# Patient Record
Sex: Female | Born: 1945
Health system: Southern US, Community
[De-identification: ages and names within clinical notes are randomized; demographics above are authoritative.]

## PROBLEM LIST (undated history)

## (undated) DIAGNOSIS — T7840XA Allergy, unspecified, initial encounter: Secondary | ICD-10-CM

## (undated) DIAGNOSIS — H269 Unspecified cataract: Secondary | ICD-10-CM

## (undated) DIAGNOSIS — M810 Age-related osteoporosis without current pathological fracture: Secondary | ICD-10-CM

## (undated) DIAGNOSIS — K5900 Constipation, unspecified: Secondary | ICD-10-CM

## (undated) DIAGNOSIS — C801 Malignant (primary) neoplasm, unspecified: Secondary | ICD-10-CM

## (undated) DIAGNOSIS — G4700 Insomnia, unspecified: Secondary | ICD-10-CM

## (undated) DIAGNOSIS — J302 Other seasonal allergic rhinitis: Secondary | ICD-10-CM

## (undated) DIAGNOSIS — C569 Malignant neoplasm of unspecified ovary: Secondary | ICD-10-CM

## (undated) DIAGNOSIS — F419 Anxiety disorder, unspecified: Secondary | ICD-10-CM

## (undated) DIAGNOSIS — M1990 Unspecified osteoarthritis, unspecified site: Secondary | ICD-10-CM

## (undated) DIAGNOSIS — E785 Hyperlipidemia, unspecified: Secondary | ICD-10-CM

## (undated) HISTORY — DX: Unspecified osteoarthritis, unspecified site: M19.90

## (undated) HISTORY — DX: Unspecified cataract: H26.9

## (undated) HISTORY — DX: Anxiety disorder, unspecified: F41.9

## (undated) HISTORY — DX: Allergy, unspecified, initial encounter: T78.40XA

## (undated) HISTORY — DX: Insomnia, unspecified: G47.00

## (undated) HISTORY — PX: THUMB FUSION: SUR636

## (undated) HISTORY — PX: ABDOMINAL HYSTERECTOMY: SHX81

## (undated) HISTORY — PX: COSMETIC SURGERY: SHX468

## (undated) HISTORY — DX: Constipation, unspecified: K59.00

## (undated) HISTORY — DX: Age-related osteoporosis without current pathological fracture: M81.0

## (undated) HISTORY — PX: WISDOM TOOTH EXTRACTION: SHX21

## (undated) HISTORY — PX: OTHER SURGICAL HISTORY: SHX169

## (undated) HISTORY — DX: Hyperlipidemia, unspecified: E78.5

---

## 2001-02-24 ENCOUNTER — Other Ambulatory Visit: Admission: RE | Admit: 2001-02-24 | Discharge: 2001-02-24 | Payer: Self-pay | Admitting: Obstetrics and Gynecology

## 2002-03-08 ENCOUNTER — Other Ambulatory Visit: Admission: RE | Admit: 2002-03-08 | Discharge: 2002-03-08 | Payer: Self-pay | Admitting: Obstetrics and Gynecology

## 2003-03-22 ENCOUNTER — Other Ambulatory Visit: Admission: RE | Admit: 2003-03-22 | Discharge: 2003-03-22 | Payer: Self-pay | Admitting: Obstetrics and Gynecology

## 2004-04-24 ENCOUNTER — Other Ambulatory Visit: Admission: RE | Admit: 2004-04-24 | Discharge: 2004-04-24 | Payer: Self-pay | Admitting: Gynecology

## 2004-10-31 ENCOUNTER — Ambulatory Visit (HOSPITAL_COMMUNITY): Admission: RE | Admit: 2004-10-31 | Discharge: 2004-10-31 | Payer: Self-pay | Admitting: Gastroenterology

## 2005-07-02 ENCOUNTER — Other Ambulatory Visit: Admission: RE | Admit: 2005-07-02 | Discharge: 2005-07-02 | Payer: Self-pay | Admitting: Gynecology

## 2006-08-19 ENCOUNTER — Other Ambulatory Visit: Admission: RE | Admit: 2006-08-19 | Discharge: 2006-08-19 | Payer: Self-pay | Admitting: Gynecology

## 2007-08-30 ENCOUNTER — Other Ambulatory Visit: Admission: RE | Admit: 2007-08-30 | Discharge: 2007-08-30 | Payer: Self-pay | Admitting: Gynecology

## 2007-10-20 ENCOUNTER — Ambulatory Visit (HOSPITAL_BASED_OUTPATIENT_CLINIC_OR_DEPARTMENT_OTHER): Admission: RE | Admit: 2007-10-20 | Discharge: 2007-10-20 | Payer: Self-pay | Admitting: Orthopedic Surgery

## 2008-07-14 HISTORY — PX: TOTAL KNEE ARTHROPLASTY: SHX125

## 2008-11-08 ENCOUNTER — Inpatient Hospital Stay (HOSPITAL_COMMUNITY): Admission: RE | Admit: 2008-11-08 | Discharge: 2008-11-11 | Payer: Self-pay | Admitting: Orthopedic Surgery

## 2010-10-22 LAB — PROTIME-INR: Prothrombin Time: 21.5 seconds — ABNORMAL HIGH (ref 11.6–15.2)

## 2010-10-22 LAB — CBC
HCT: 27 % — ABNORMAL LOW (ref 36.0–46.0)
Platelets: 210 10*3/uL (ref 150–400)
RBC: 2.92 MIL/uL — ABNORMAL LOW (ref 3.87–5.11)
WBC: 9.6 10*3/uL (ref 4.0–10.5)

## 2010-10-23 LAB — DIFFERENTIAL
Basophils Absolute: 0 10*3/uL (ref 0.0–0.1)
Basophils Relative: 1 % (ref 0–1)
Eosinophils Absolute: 0.1 10*3/uL (ref 0.0–0.7)
Eosinophils Relative: 2 % (ref 0–5)
Neutrophils Relative %: 54 % (ref 43–77)

## 2010-10-23 LAB — ABO/RH: ABO/RH(D): A POS

## 2010-10-23 LAB — COMPREHENSIVE METABOLIC PANEL
ALT: 26 U/L (ref 0–35)
AST: 27 U/L (ref 0–37)
Alkaline Phosphatase: 61 U/L (ref 39–117)
CO2: 30 mEq/L (ref 19–32)
Chloride: 102 mEq/L (ref 96–112)
GFR calc Af Amer: >60 mL/min (ref >60)
GFR calc non Af Amer: >60 mL/min (ref >60)
Glucose, Bld: 87 mg/dL (ref 70–99)
Sodium: 136 mEq/L (ref 135–145)
Total Bilirubin: 0.4 mg/dL (ref 0.3–1.2)

## 2010-10-23 LAB — CBC
Hemoglobin: 13.2 g/dL (ref 12.0–15.0)
Hemoglobin: 9.5 g/dL — ABNORMAL LOW (ref 12.0–15.0)
MCV: 92.9 fL (ref 78.0–100.0)
RBC: 3 MIL/uL — ABNORMAL LOW (ref 3.87–5.11)
RBC: 3.18 MIL/uL — ABNORMAL LOW (ref 3.87–5.11)
RBC: 4.19 MIL/uL (ref 3.87–5.11)
WBC: 13.3 10*3/uL — ABNORMAL HIGH (ref 4.0–10.5)
WBC: 7.1 10*3/uL (ref 4.0–10.5)

## 2010-10-23 LAB — PROTIME-INR
INR: 1.1 (ref 0.00–1.49)
INR: 1.8 — ABNORMAL HIGH (ref 0.00–1.49)
Prothrombin Time: 12.1 seconds (ref 11.6–15.2)
Prothrombin Time: 14.2 seconds (ref 11.6–15.2)

## 2010-10-23 LAB — URINALYSIS, ROUTINE W REFLEX MICROSCOPIC
Bilirubin Urine: NEGATIVE
Glucose, UA: NEGATIVE mg/dL
Hgb urine dipstick: NEGATIVE
Ketones, ur: NEGATIVE mg/dL
Protein, ur: NEGATIVE mg/dL

## 2010-10-23 LAB — BASIC METABOLIC PANEL
Chloride: 99 mEq/L (ref 96–112)
Creatinine, Ser: 0.64 mg/dL (ref 0.4–1.2)
GFR calc Af Amer: >60 mL/min (ref >60)
Sodium: 132 mEq/L — ABNORMAL LOW (ref 135–145)

## 2010-10-23 LAB — TYPE AND SCREEN: ABO/RH(D): A POS

## 2010-11-26 NOTE — Op Note (Signed)
NAME:  Angela Garza, Angela Garza                ACCOUNT NO.:  [id number]   MEDICAL RECORD NO.:  [id number]          PATIENT TYPE:  INP   LOCATION:  5015                         FACILITY:  [facility]   PHYSICIAN:  Harvie Junior, M.D.   DATE OF BIRTH:  December 28, 1945   DATE OF PROCEDURE:  11/08/2008  DATE OF DISCHARGE:                               OPERATIVE REPORT   PREOPERATIVE DIAGNOSIS:  End-stage degenerative joint disease of the  left knee with severe patellofemoral chondromalacia.   POSTOPERATIVE DIAGNOSIS:  End-stage degenerative joint disease of the  left knee with severe patellofemoral chondromalacia.   PROCEDURES:  1. Left total knee replacement with a Sigma System with a size 4      narrow femur, size 3 tibia, a 10-mm bridging bearing and a 35-mm      all poly patella.  2. Computer-assisted left total knee replacement   SURGEON:  Harvie Junior, MD   ASSISTANT:  Marshia Ly, PA   ANESTHESIA:  General.   BRIEF HISTORY:  Mrs. Capes is a 65 year old female with a long history  of having a severe patellofemoral disease on her left knee.  We treated  her with arthroscopy, lateral release and debridement.  She had also had  some fairly significant chondromalacia on the medial side and some on  the lateral side certainly her greatest disease in the patellofemoral  area.  She struggled with this for a prolonged period of time because of  continued complaints of pain in the patellofemoral area.  She has also  been taken to the operating room for total knee replacement.  Because of  her young age, we felt that computer-assisted total knee replacement  would be the most appropriate course of action and this was chosen to be  used preoperatively.   PROCEDURE:  The patient was taken to the operating room.  After adequate  anesthesia was obtained with general anesthetic, the patient was placed  supine on the operating table.  The left leg was prepped and draped in  usual sterile fashion.   Following this, the leg was exsanguinated and  blood pressure tourniquet was inflated to 350 mmHg.  Following this, a  midline incision was made.  Subcutaneous tissues were dissected down to  the level of the extensor mechanism where a medial parapatellar  arthrotomy was undertaken.  Following this the medial and lateral  meniscus were removed as well as anterior and posterior cruciates as  well as the retropatellar fat pad and at this point attention was turned  towards the computer modules where two pins were placed in the tibia,  two pins in the femur and the arrays were placed.  Once this was  completed, the hip was put through a range of motion and the  registration process undertaken.  This adds about 30 minutes of the  surgical procedure.  Once the range of motion have been tested then the  cuts were made.  We cut the tibia perpendicular to the long axis under  computer assistance, cut the femur perpendicular to the anatomic axis  under computer assistance.  Interestingly,  prior to going to the flexion  gap, the width of the femur tested at about 2.5.  Ultimately when we got  to the anterior and posterior dimension that really measured to a size  4.  This was a little bit concerning just relative to the overall  potential for overhang in the case.  Ultimately, there was a 4 narrow  which guide [place] essentially to a 3 in the medial and lateral side and  because of the balancing of the flexion/extension gap, we felt that 4  narrow was going to be the appropriate implant.  At this point, the size  4 cutting guide was used to cut the anterior posterior areas and  following this we went to the chamfer cuts and then went to the box cut.  The 4 narrow did have a little bit of overhang medially and laterally up  on the femur but otherwise was a reasonable fit down over the condyles.  Attention was then turned to the tibia which was sized to a 3 and the  rotational alignment was then chosen as  well as the keel and this was  put into place.  Once this was completed, a 10-mm bridging bearing was  placed and a trial range of motion with full extension, computer  assistance alignment we had perfect neutral long alignment.  We cut the  patella down to a size 13 and then used a 35 paddle.  Still there was  some ridges especially on that lateral side which were deeper than this  12.  We put the paddle on and did those lugs put the lugs on the femur.  At this point, all the trial components were removed.  The knee was  copiously and thoroughly lavaged and irrigated.  The final components  were cemented into place size 4 narrow femur, size 3 tibia, 10-mm  bridging bearing and a 35-mm all poly patella.  We allowed the cement to  set up and checked the computer one more time had perfect neutral long  alignment.  We had excellent stability in both flexion/extension and  perfect gap balance of flexion/extension.  At this point, the cement was  allowed to harden and the patellar clamp was removed.  The knee was  copiously and thoroughly irrigated and put through a range of motion.  Excellent midline patellar tracking at this point and at this point the  medium Hemovac drain was placed.  A medial parapatellar arthrotomy was  closed with 1 Vicryl running, the skin with 0 and 2-0 Vicryl and 3-0  Monocryl subcuticular stitches.  Benzoin and Steri-Strips were applied.  Sterile compressive dressing was applied.  The patient was taken to the  recovery room where she was noted to be in satisfactory condition.  Estimated blood loss for the procedure was none.  Of note, when the  final components were cemented into place, we used a size 10 trial and  after the trial had been removed the tourniquet was let down.  We  controlled all bleeding with electrocautery and the final poly was then  placed and we then went through a range of motion at this point and this  was excellent and significant bleeding was  encountered.  Knee was again  copiously and thoroughly lavaged and then closed as outlined above.      Harvie Junior, M.D.  Electronically Signed     JLG/MEDQ  D:  11/08/2008  T:  11/09/2008  Job:  [id number]

## 2010-11-26 NOTE — Op Note (Signed)
NAME:  Angela Garza, Angela Garza                ACCOUNT NO.:  [id number]   MEDICAL RECORD NO.:  [id number]          PATIENT TYPE:  AMB   LOCATION:  DSC                          FACILITY:  [facility]   PHYSICIAN:  Harvie Junior, M.D.   DATE OF BIRTH:  [birth year]   DATE OF PROCEDURE:  10/20/2007  DATE OF DISCHARGE:                               OPERATIVE REPORT   PREOPERATIVE DIAGNOSIS:  Medial meniscal tear with chondromalacia  patella.   POSTOPERATIVE DIAGNOSIS:  Medial meniscal tear with chondromalacia  patella.   PROCEDURE:  1. Partial posterior horn medial meniscectomy.  2. Debridement of chondromalacia patella.   SURGEON:  Harvie Junior, M.D.   ASSISTANT:  Marshia Ly, P.A.   ANESTHESIA:  General.   BRIEF HISTORY:  The patient is a 64 year old female with a long history  of having had significant bilateral knee pain with severe patellofemoral  disease bilaterally.  She had undergone bilateral arthroscopy with  lateral retinacular release several years ago that had done well for  her.  She began having increasing catching and grabbing pain on the  inboard side and because of this she was taken to the operating room for  operative knee arthroscopy after failure of conservative care including  injection therapy, anti-inflammatory medication.   DESCRIPTION OF PROCEDURE:  The patient was taken to the operating room  and after adequate anesthesia was obtained by general anesthesia, the  patient was placed on the operating table.  The left leg was prepped and  draped in the usual sterile fashion.  Following this routine  arthroscopic examination of the knee revealed there was an obvious  posterior horn medial meniscectomy.  As we debrided the posterior horn,  we actually found the opening into the cystic area posteriorly and the  posterior horn was debrided to allow saucerization of the cyst.  Once  this was completed, attention was turned to the ACL which was normal.  Lateral side had a  little bit of an anterior horn tear which was  debrided.  The lateral side had some degenerative changes but not bad.  The medial side did have a grade 3 change over a fairly significant area  which was debrided.  Some of these fragments were loose, so we flexed  her back to about 100 degrees and debrided these fragments.  Once this  was completed, attention was then turned up into the patellofemoral  joint where there was that obvious grooved area.  This grooved area  really came over the top and almost into the distal femoral area, quite  an impressive grooving of the lateral femoral condyle.  This was  debrided.  The lateral retinacular release was checked and this  certainly was doing well and there was not significant pressure on the  patellofemoral joint.  At this point the knee was copiously and  thoroughly irrigated and suctioned dry.  Final check was made for any  loose fragments and seeing none, this was thoroughly irrigated and  suctioned  dry.  The arthroscopic portals were closed with a bandage.  Sterile  compressive dressing was applied.  The patient was taken to the recovery  room where she was noted to be in satisfactory condition.  Estimated  blood loss for the procedure was none.      Harvie Junior, M.D.  Electronically Signed     JLG/MEDQ  D:  10/20/2007  T:  10/20/2007  Job:  [id number]

## 2010-11-29 NOTE — Discharge Summary (Signed)
NAMEKHARISMA, GLASNER                ACCOUNT NO.:  [id number]   MEDICAL RECORD NO.:  [id number]          PATIENT TYPE:  INP   LOCATION:  5015                         FACILITY:  [facility]   PHYSICIAN:  Harvie Junior, M.D.   DATE OF BIRTH:  10-12-1945   DATE OF ADMISSION:  11/08/2008  DATE OF DISCHARGE:  11/11/2008                               DISCHARGE SUMMARY   ADMITTING DIAGNOSES:  1. Degenerative joint disease, left knee.  2. Osteopenia.  3. Hyperlipidemia.  4. History of ovarian cancer.  5. History of benign cardiac palpitations.   DISCHARGE DIAGNOSES:  1. Degenerative joint disease, left knee.  2. Osteopenia.  3. Hyperlipidemia.  4. History of ovarian cancer.  5. History of benign cardiac palpitations.   PROCEDURES IN THE HOSPITAL:  The left total knee arthroplasty, Harvie Junior, MD, November 08, 2008.   BRIEF HISTORY:  Anastasha Ortez is a 65 year old female who has a long  history of left knee pain.  She complains of pain with ambulation at  rest.  She got no relief with conservative treatment.  X-rays showed  severe patellofemoral arthritis of the left knee with joint space  narrowing medially.  She had marked spurring in the patellofemoral  joint.  She got no relief with exhaustive conservative treatment  including injection therapy, knee arthroscopy, and use of medication and  modification of activity.  Based upon her clinical and radiographic  findings, she is felt to be a candidate for a left total knee  arthroplasty and is admitted for this.   PERTINENT LABORATORY STUDIES:  Her EKG on admission showed normal sinus  rhythm with possible left atrial enlargement.  No significant change  since last tracing.  Hemoglobin on admission was 13.2 and hematocrit  38.9.  Indices within normal limits.  On postop day #1, her hemoglobin  was 7.1; #2, 9.5; and #3 9.2.  Her Protime on admission was 12.1 seconds  and INR of 0.9.  PTT was 24.  On the day of discharge, her Protime was  21.5 seconds with an INR of 1.8.  Her CMET on admission was within  normal limits with a slightly decreased potassium at 132, now felt to be  clinically significant.  Urinalysis showed no abnormalities.   HOSPITAL COURSE:  The patient underwent computer-assisted left total  knee replacement as well described in Dr. Luiz Blare' operative note on the  day of admission, which was, November 08, 2008.  Procedure went without  complications.  Preoperatively, she was given a gram of Ancef and 80 mg  of gentamicin and 1 g of Ancef was given q.8 h. x3 doses  postoperatively.  She started on Coumadin antithrombotic therapy per  pharmacy.  PCA Dilaudid was used for pain control.   On postop day #1, the patient stated that she had moderate knee pain.  She is taking fluids without difficulty.  She was voiding as her Foley  was removed.  Her vital signs are stable.  She is afebrile.  Her  hemoglobin was 10.1.  Her BMET was within normal range.  Her INR was  1.1.  She is continued on the PCA Dilaudid and she was mobilized with  physical therapy.   On postop day #2, the patient was overall improving, making some  progress.  She had no complaints.  She was taking fluids and voiding  without difficulty.  She has progressed well with therapy.  She did have  a temperature of 100.9.  Her lungs are clear.  Her left knee wound was  benign.  Her dressing was changed and her Hemovac drain was pulled.  Her  hemoglobin is 9.5.  Her INR was 1.8.  Her IV and PCA Dilaudid were  discontinued and her IV was converted to a saline lock.   On postop day #3, the patient was doing well.  She is taking fluids  without difficulty and voiding.  She had a low-grade fever of 100.3, was  then found to be afebrile.  Her left knee dressing was benign.  Her  hemoglobin was 9.2.  She was discharged home in improved condition.  She  was given Rx for Percocet 5 mg p.r.n. for pain, Coumadin as rated x1  month postop for DVT prophylaxis and  Robaxin 750 mg 1 q.8 h. p.r.n.  spasm.  She will get home health physical therapy and a home health RN  for Protimes and Coumadin management x1 month.  She will only get a home  CPM for her knee range of motion.  She will ambulate weightbearing as  tolerated on the left with a walker.  She will follow up with Dr. Luiz Blare  in the office in 10-11 days.  If she has any problem, she will let us  know.      Marshia Ly, P.A.      Harvie Junior, M.D.  Electronically Signed    JB/MEDQ  D:  01/10/2009  T:  01/11/2009  Job:  [id number]

## 2010-11-29 NOTE — Op Note (Signed)
Angela Garza, Angela Garza                ACCOUNT NO.:  [id number]   MEDICAL RECORD NO.:  [id number]          PATIENT TYPE:  AMB   LOCATION:  ENDO                         FACILITY:  [facility]   PHYSICIAN:  John C. Madilyn Fireman, M.D.    DATE OF BIRTH:  [birth year]   DATE OF PROCEDURE:  10/31/2004  DATE OF DISCHARGE:                                 OPERATIVE REPORT   PROCEDURE PERFORMED:  Colonoscopy.   INDICATIONS FOR PROCEDURE:  Family history of colon cancer in first-degree  relative.   PROCEDURE:  The patient was placed in the left lateral decubitus position  and placed on the pulse monitor with continuous low-flow oxygen delivered by  nasal cannula. She was sedated with 75 mcg IV fentanyl and 9 milligrams IV  Versed. Olympus video colonoscope was inserted into the rectum and advanced  to cecum with significant difficulty. There was a lot of angulation at the  sigmoid and rectosigmoid junction presumably from adhesions and there was a  lot of resistance to passage of scope beyond that point to the cecum but  eventually it was reached.  The prep was excellent. The cecum, ascending,  transverse, descending sigmoid and rectum all appeared normal with no  masses, polyps, diverticula or other mucosal abnormalities. The scope was  then withdrawn and the patient returned to the recovery room in stable  condition. She tolerated the procedure well. There were no immediate.   IMPRESSION:  Normal colonoscopy with some difficulty in advancing the scope  presumed secondary to adhesions.   PLAN:  Repeat study in five years.      JCH/MEDQ  D:  10/31/2004  T:  10/31/2004  Job:  [id number]   cc:   Dr. Roney Marion, [place]

## 2011-04-08 LAB — POCT HEMOGLOBIN-HEMACUE: Hemoglobin: 14.2

## 2013-04-10 ENCOUNTER — Observation Stay: Payer: Self-pay | Admitting: Internal Medicine

## 2013-04-10 LAB — CBC
HCT: 39.2 % (ref 35.0–47.0)
HGB: 13.3 g/dL (ref 12.0–16.0)
MCH: 30.6 pg (ref 26.0–34.0)
MCHC: 33.9 g/dL (ref 32.0–36.0)
Platelet: 263 10*3/uL (ref 150–440)
RBC: 4.33 10*6/uL (ref 3.80–5.20)
WBC: 13.3 10*3/uL — ABNORMAL HIGH (ref 3.6–11.0)

## 2013-04-10 LAB — COMPREHENSIVE METABOLIC PANEL
Albumin: 4 g/dL (ref 3.4–5.0)
BUN: 15 mg/dL (ref 7–18)
Bilirubin,Total: 0.2 mg/dL (ref 0.2–1.0)
Calcium, Total: 8.8 mg/dL (ref 8.5–10.1)
Chloride: 104 mmol/L (ref 98–107)
Co2: 28 mmol/L (ref 21–32)
EGFR (African American): >60
EGFR (Non-African Amer.): >60
Osmolality: 272 (ref 275–301)
SGOT(AST): 28 U/L (ref 15–37)
Total Protein: 7.1 g/dL (ref 6.4–8.2)

## 2013-04-10 LAB — TROPONIN I: Troponin-I: <0.02 ng/mL

## 2013-04-11 LAB — CBC WITH DIFFERENTIAL/PLATELET
Basophil #: 0 10*3/uL (ref 0.0–0.1)
Eosinophil #: 0.1 10*3/uL (ref 0.0–0.7)
HGB: 13.1 g/dL (ref 12.0–16.0)
Lymphocyte #: 1.1 10*3/uL (ref 1.0–3.6)
Lymphocyte %: 10.1 %
Neutrophil %: 84.7 %
Platelet: 253 10*3/uL (ref 150–440)
RBC: 4.21 10*6/uL (ref 3.80–5.20)
WBC: 10.7 10*3/uL (ref 3.6–11.0)

## 2013-04-11 LAB — LIPID PANEL
Triglycerides: 84 mg/dL (ref 0–200)
VLDL Cholesterol, Calc: 17 mg/dL (ref 5–40)

## 2013-04-11 LAB — TROPONIN I: Troponin-I: <0.02 ng/mL

## 2013-04-11 LAB — MAGNESIUM: Magnesium: 2.1 mg/dL

## 2014-11-03 NOTE — Consult Note (Signed)
PATIENT NAME:  Angela Garza, Angela Garza MR#:  [id number] DATE OF BIRTH:  1946-07-03  CARDIOLOGY CONSULTATION REPORT  DATE OF CONSULTATION:  04/11/2013  CONSULTING PHYSICIAN: Dr. Bridgett Larsson.   REASON FOR CONSULTATION: Chest pressure, with abnormal EKG.   CHIEF COMPLAINT: "I had chest pressure."   HISTORY OF PRESENT ILLNESS: This is a 69 year old female with a significant reaction to ant bites, with shortness of breath, swelling, rash, and chest pressure. The chest pressure did resolve fairly quickly after treatment of her rash and difficulty with breathing. The patient has had improvements of this rash. Now has an EKG change consistent with normal sinus rhythm with diffuse T wave inversions with incomplete right bundle branch block. The patient has not had any evidence of a myocardial infarction, with current normal troponin and CK-MB. The patient has had full resolution of her symptoms at this time.   The remainder review of systems negative for vision change, ringing in the ears, hearing loss, cough, congestion, heartburn, nausea, vomiting, diarrhea, bloody stools, stomach pain, extremity pain, leg weakness, cramping of the buttocks, known blood clots, headaches, blackouts, dizzy spells, nosebleeds, congestion, trouble swallowing, frequent urination, urination at night, muscle weakness, numbness, anxiety, depression,   PAST MEDICAL HISTORY: None.   FAMILY HISTORY: Mother had heart failure.   SOCIAL HISTORY: Currently denies alcohol or tobacco use.    ALLERGIES TO MEDICATIONS: AS LISTED.   PHYSICAL EXAM: VITAL SIGNS: Blood pressure is 122/68 bilaterally, heart rate 72 upright, reclining, and regular.  GENERAL: She is a well-appearing female in no acute distress.  HEENT: No icterus, thyromegaly, ulcers, hemorrhage, or xanthelasma.  CARDIOVASCULAR: Regular rate and rhythm. Normal S1 and S2 without murmur, gallop, or rub. PMI is normal size and placement. Carotid upstrokes normal, without bruit. Jugular venous  pressure is normal.  LUNGS: Lungs are clear to auscultation, with normal respirations.  ABDOMEN: Soft, nontender, without hepatosplenomegaly or masses. Abdominal aorta is normal-sized, without bruit.  EXTREMITIES: Show 2+ bilateral pulses in dorsal, pedal, radial and femoral arteries, without lower extremity edema, cyanosis, clubbing, or ulcers.  NEUROLOGIC: She is oriented to time, place, and person, with normal mood and affect.   ASSESSMENT: A 69 year old female with reaction to ant bites and abnormal EKG, with chest pressure, with very little risk factors and no current evidence of myocardial infarction, need further evaluation.   RECOMMENDATIONS: 1.  Echocardiogram for LV dysfunction, valvular heart disease causing abnormal EKG.  2. Ambulate and follow for any further significant symptoms, with possible discharge to home. Will surely follow up thereafter, depending on symptoms.  3. No need in additional medication management due to normal blood pressure.  4.  Further treatment options after above.     ____________________________ Corey Skains, MD bjk:[id number] D: 04/11/2013 08:25:57 ET T: 04/11/2013 08:36:42 ET JOB#: [id number]  cc: Corey Skains, MD, <Dictator> Corey Skains MD ELECTRONICALLY SIGNED 04/12/2013 10:36

## 2014-11-03 NOTE — H&P (Signed)
PATIENT NAME:  Angela Garza, Angela Garza MR#:  [id number] DATE OF BIRTH:  August 29, 1945  DATE OF ADMISSION:  04/10/2013  REFERRING PHYSICIAN:   Conni Slipper, MD  PRIMARY CARE PHYSICIAN: Nonlocal  CHIEF COMPLAINT: Insect bites and chest tightness today.   HISTORY OF PRESENT ILLNESS: A 69 year old Caucasian female with a history of hyperlipidemia, osteoarthritis, heart problem, presented to the ED with the above chief complaint. The patient is alert, awake, oriented and in no acute distress. The patient said she got an insect bite in her backyard and developed a rash on the left foot, and she feels itchy and felt chest tightness but she did not complain of any other symptoms. No orthopnea, no nocturnal dyspnea or leg edema. No cough, sputum or shortness of breath, but the patient said she has some tongue swelling, numb. She feels nervous.  The patient's EKG is abnormal. Dr. Cinda Quest requests the patient placed for observation.   PAST MEDICAL HISTORY: Hyperlipidemia, osteoarthritis, heart problem.   SOCIAL HISTORY: No smoking, alcohol drinking or illicit drugs.   FAMILY HISTORY: No hypertension, diabetes, heart attack or stroke, but has mother has high cholesterol and father has a colon cancer.   PAST SURGICAL HISTORY: Left knee replacement.   REVIEW OF SYSTEMS:   CONSTITUTIONAL: The patient denies any fever or chills. No headache or dizziness. No weakness.  EYES: No double vision or blurred vision.  EARS, NOSE, THROAT: No postnasal drip, slurred speech or dysphagia.  CARDIOVASCULAR: Positive for chest tightness, but no orthopnea, nocturnal dyspnea. No leg edema.  PULMONARY: No cough, sputum, shortness of breath or hemoptysis.  GASTROINTESTINAL: No abdominal pain, nausea, vomiting or diarrhea. No melena or bloody stool.  GENITOURINARY: No dysuria, hematuria, or incontinence.  SKIN: No rash or jaundice.  NEUROLOGY: No syncope, loss of consciousness or seizure.  HEMATOLOGY: No easy bruising or bleeding.   ENDOCRINE: No polyuria or polydipsia, heat or cold intolerance.   PHYSICAL EXAMINATION: VITAL SIGNS: Temperature 98, blood pressure 177/66, pulse 76, O2 saturation 100% on oxygen.  GENERAL: The patient is alert, awake, oriented, in no acute distress.  HEENT: Pupils are round, equal and reactive to light and accommodation.  Moist oral mucosa. Clear oropharynx. No swelling of tongue or lip. NECK: Supple. No JVD or carotid bruit. No lymphadenopathy. No thyromegaly.  CARDIOVASCULAR: S1, S2 regular rate and rhythm. No murmurs, gallops.   PULMONARY: Bilateral air entry. No wheezing or rales. No use of accessory muscle to breathe.  ABDOMEN: Soft. No distention. No tenderness. No organomegaly. Bowel sounds present.  EXTREMITIES: No edema, clubbing or cyanosis. No calf tenderness but has a local rash on the left foot with mild swelling and mild tenderness. Bilateral strong bilateral pedal pulses.  SKIN: Has a rash all over the body. No jaundice, no bruises.  NEUROLOGY: A and O x 3. No focal deficits. Point 5 out of 5. Sensation intact.   LABORATORY AND DIAGNOSTIC DATA: WBC 13.3, hemoglobin 13.3, platelets are 263. Troponin less than 0.02. Glucose 80, BUN 15, creatinine 0.93. Electrolytes normal. EKG showed normal sinus rhythm at 77 bpm with multiple lead T- wave inversion.   IMPRESSIONS: 1.  Chest tightness, chest pain, possibly due to an insect bite.  2.  Insect bite with allergy. 3.  Leukocytosis, possibly due to reaction.  4.  Abnormal EKG.  5.  Hypertension.  6.  Hyperlipidemia.   PLAN OF TREATMENT:   1.  The patient will be placed for observation. We will continue telemonitor. We will increase the aspirin to 325 mg  p.o. daily.  We will continue Zocor. Get a Cardiology consult. I discussed with Dr. Nehemiah Massed on the phone.  He suggested continue to the treatment, but no beta blocker at this time.  2.  We will follow up lipid panel, troponin and CBC.  3.  We will give Benadryl p.r.n. for  allergy.   CODE STATUS:  The patient is a FULL CODE.     TIME SPENT: About 48 minutes.    ____________________________ Demetrios Loll, MD qc:cb D: 04/10/2013 20:08:14 ET T: 04/10/2013 20:54:20 ET JOB#: [id number]  cc: Demetrios Loll, MD, <Dictator> Demetrios Loll MD ELECTRONICALLY SIGNED 04/11/2013 16:59

## 2014-11-03 NOTE — Discharge Summary (Signed)
PATIENT NAME:  Angela Garza, Angela Garza MR#:  [id number] DATE OF BIRTH:  11-10-1945  DATE OF ADMISSION:  04/10/2013 DATE OF DISCHARGE: 04/12/2013    DISCHARGE DIAGNOSES:  1.  Acute reaction to ant bite.  2.  EKG changes with no acute coronary syndrome and a normal echocardiogram.   CONSULTS: Dr. Nehemiah Massed.   IMAGING STUDIES DONE: Include a chest x-ray, portable, which showed no acute cardiopulmonary disease. Echocardiogram, which showed ejection fraction of 60% to 65% with normal wall motion. No significant valvular abnormalities.   ADMITTING HISTORY AND PHYSICAL: Please see detailed H and P dictated by Dr. Bridgett Larsson. In brief, a 69 year old female patient with history of hyperlipidemia, osteoarthritis, presented to the hospital complaining of insect bites and chest tightness, which was acute onset. The patient was admitted to the hospitalist service to treat her allergy and rule out acute coronary syndrome.   HOSPITAL COURSE:  1.  Allergic reaction. The patient was started on Solu-Medrol along with Benadryl p.r.n. for skin redness. Her redness has significantly improved. She had mild tongue swelling initially, which has resolved. No shortness of breath, wheezing or dysphagia. She will be on prednisone taper after discharge.  2.  Chest tightness. This is likely secondary from anxiety. She did have some EKG changes for which Dr. Nehemiah Massed of cardiology saw the patient. He did not feel this is acute coronary syndrome, but she did get an echocardiogram, which showed no wall motion abnormalities and has been cleared for discharge home from cardiac standpoint by Dr. Nehemiah Massed.   Prior to discharge, the patient has mild erythema in her feet. No tongue swelling. No wheezing on lung examination.   DISCHARGE MEDICATIONS: Include:  1.  Simvastatin 20 mg oral once a day.  2.  Calcium with vitamin D 1 tablet oral 3 times a day.  3.  Aspirin 81 mg daily.  4,  Benadryl 25 mg 3 times a day as needed for itching.  5.   Prednisone 60 mg tapered over 6 days.   DISCHARGE INSTRUCTIONS: Low-fat, low-cholesterol diet. Activity as tolerated. Follow up with primary care physician in 1 to 2 weeks.   Time spent on day of discharge was 35 minutes.  ____________________________ Leia Alf Tyr Franca, MD srs:aw D: 04/12/2013 13:21:20 ET T: 04/12/2013 13:44:43 ET JOB#: [id number]  cc: Alveta Heimlich R. Mardee Clune, MD, <Dictator> Neita Carp MD ELECTRONICALLY SIGNED 04/13/2013 15:32

## 2015-01-12 HISTORY — PX: COLONOSCOPY: SHX174

## 2016-08-07 DIAGNOSIS — H26492 Other secondary cataract, left eye: Secondary | ICD-10-CM | POA: Diagnosis not present

## 2016-08-07 DIAGNOSIS — H47323 Drusen of optic disc, bilateral: Secondary | ICD-10-CM | POA: Diagnosis not present

## 2016-08-07 DIAGNOSIS — H26491 Other secondary cataract, right eye: Secondary | ICD-10-CM | POA: Diagnosis not present

## 2016-08-20 DIAGNOSIS — Z789 Other specified health status: Secondary | ICD-10-CM | POA: Diagnosis not present

## 2016-08-20 DIAGNOSIS — M859 Disorder of bone density and structure, unspecified: Secondary | ICD-10-CM | POA: Diagnosis not present

## 2016-08-20 DIAGNOSIS — Z0000 Encounter for general adult medical examination without abnormal findings: Secondary | ICD-10-CM | POA: Diagnosis not present

## 2016-08-20 DIAGNOSIS — Z8543 Personal history of malignant neoplasm of ovary: Secondary | ICD-10-CM | POA: Diagnosis not present

## 2016-08-20 DIAGNOSIS — E7800 Pure hypercholesterolemia, unspecified: Secondary | ICD-10-CM | POA: Diagnosis not present

## 2016-08-26 DIAGNOSIS — H4420 Degenerative myopia, unspecified eye: Secondary | ICD-10-CM | POA: Diagnosis not present

## 2016-09-04 DIAGNOSIS — R69 Illness, unspecified: Secondary | ICD-10-CM | POA: Diagnosis not present

## 2016-10-06 DIAGNOSIS — Z1231 Encounter for screening mammogram for malignant neoplasm of breast: Secondary | ICD-10-CM | POA: Diagnosis not present

## 2016-12-22 DIAGNOSIS — L820 Inflamed seborrheic keratosis: Secondary | ICD-10-CM | POA: Diagnosis not present

## 2016-12-22 DIAGNOSIS — L578 Other skin changes due to chronic exposure to nonionizing radiation: Secondary | ICD-10-CM | POA: Diagnosis not present

## 2016-12-22 DIAGNOSIS — D485 Neoplasm of uncertain behavior of skin: Secondary | ICD-10-CM | POA: Diagnosis not present

## 2016-12-22 DIAGNOSIS — L728 Other follicular cysts of the skin and subcutaneous tissue: Secondary | ICD-10-CM | POA: Diagnosis not present

## 2016-12-22 DIAGNOSIS — D225 Melanocytic nevi of trunk: Secondary | ICD-10-CM | POA: Diagnosis not present

## 2017-01-16 DIAGNOSIS — Z7982 Long term (current) use of aspirin: Secondary | ICD-10-CM | POA: Diagnosis not present

## 2017-01-16 DIAGNOSIS — M19049 Primary osteoarthritis, unspecified hand: Secondary | ICD-10-CM | POA: Diagnosis not present

## 2017-01-16 DIAGNOSIS — H9113 Presbycusis, bilateral: Secondary | ICD-10-CM | POA: Diagnosis not present

## 2017-01-16 DIAGNOSIS — Z0000 Encounter for general adult medical examination without abnormal findings: Secondary | ICD-10-CM | POA: Diagnosis not present

## 2017-01-16 DIAGNOSIS — M818 Other osteoporosis without current pathological fracture: Secondary | ICD-10-CM | POA: Diagnosis not present

## 2017-01-16 DIAGNOSIS — G4700 Insomnia, unspecified: Secondary | ICD-10-CM | POA: Diagnosis not present

## 2017-01-16 DIAGNOSIS — J309 Allergic rhinitis, unspecified: Secondary | ICD-10-CM | POA: Diagnosis not present

## 2017-01-16 DIAGNOSIS — E785 Hyperlipidemia, unspecified: Secondary | ICD-10-CM | POA: Diagnosis not present

## 2017-01-16 DIAGNOSIS — R600 Localized edema: Secondary | ICD-10-CM | POA: Diagnosis not present

## 2017-01-16 DIAGNOSIS — M1610 Unilateral primary osteoarthritis, unspecified hip: Secondary | ICD-10-CM | POA: Diagnosis not present

## 2017-02-19 DIAGNOSIS — M810 Age-related osteoporosis without current pathological fracture: Secondary | ICD-10-CM | POA: Diagnosis not present

## 2017-02-19 DIAGNOSIS — Z8543 Personal history of malignant neoplasm of ovary: Secondary | ICD-10-CM | POA: Diagnosis not present

## 2017-02-19 DIAGNOSIS — M1990 Unspecified osteoarthritis, unspecified site: Secondary | ICD-10-CM | POA: Diagnosis not present

## 2017-02-19 DIAGNOSIS — G4700 Insomnia, unspecified: Secondary | ICD-10-CM | POA: Diagnosis not present

## 2017-02-19 DIAGNOSIS — E7800 Pure hypercholesterolemia, unspecified: Secondary | ICD-10-CM | POA: Diagnosis not present

## 2017-03-05 DIAGNOSIS — R69 Illness, unspecified: Secondary | ICD-10-CM | POA: Diagnosis not present

## 2017-03-09 DIAGNOSIS — M6588 Other synovitis and tenosynovitis, other site: Secondary | ICD-10-CM | POA: Diagnosis not present

## 2017-03-09 DIAGNOSIS — M2041 Other hammer toe(s) (acquired), right foot: Secondary | ICD-10-CM | POA: Diagnosis not present

## 2017-03-10 DIAGNOSIS — R69 Illness, unspecified: Secondary | ICD-10-CM | POA: Diagnosis not present

## 2017-04-01 DIAGNOSIS — M1711 Unilateral primary osteoarthritis, right knee: Secondary | ICD-10-CM | POA: Diagnosis not present

## 2017-04-01 DIAGNOSIS — M25561 Pain in right knee: Secondary | ICD-10-CM | POA: Diagnosis not present

## 2017-04-10 DIAGNOSIS — K439 Ventral hernia without obstruction or gangrene: Secondary | ICD-10-CM | POA: Diagnosis not present

## 2017-04-21 DIAGNOSIS — R1030 Lower abdominal pain, unspecified: Secondary | ICD-10-CM | POA: Diagnosis not present

## 2017-04-28 ENCOUNTER — Other Ambulatory Visit: Payer: Self-pay | Admitting: Surgery

## 2017-04-28 DIAGNOSIS — R1030 Lower abdominal pain, unspecified: Secondary | ICD-10-CM

## 2017-05-11 ENCOUNTER — Ambulatory Visit
Admission: RE | Admit: 2017-05-11 | Discharge: 2017-05-11 | Disposition: A | Discharge status: Home / Self Care | Payer: Medicare HMO | Source: Ambulatory Visit | Attending: Surgery | Admitting: Surgery

## 2017-05-11 DIAGNOSIS — K8020 Calculus of gallbladder without cholecystitis without obstruction: Secondary | ICD-10-CM | POA: Diagnosis not present

## 2017-05-11 DIAGNOSIS — R1030 Lower abdominal pain, unspecified: Secondary | ICD-10-CM

## 2017-05-11 MED ORDER — IOPAMIDOL (ISOVUE-300) INJECTION 61%
100.0000 mL | Freq: Once | INTRAVENOUS | Status: AC | PRN
  Administered 2017-05-11: 100 mL via INTRAVENOUS

## 2017-05-19 DIAGNOSIS — H903 Sensorineural hearing loss, bilateral: Secondary | ICD-10-CM | POA: Diagnosis not present

## 2017-05-19 DIAGNOSIS — R1030 Lower abdominal pain, unspecified: Secondary | ICD-10-CM | POA: Diagnosis not present

## 2017-07-21 DIAGNOSIS — D1801 Hemangioma of skin and subcutaneous tissue: Secondary | ICD-10-CM | POA: Diagnosis not present

## 2017-07-21 DIAGNOSIS — L578 Other skin changes due to chronic exposure to nonionizing radiation: Secondary | ICD-10-CM | POA: Diagnosis not present

## 2017-07-21 DIAGNOSIS — L821 Other seborrheic keratosis: Secondary | ICD-10-CM | POA: Diagnosis not present

## 2017-08-11 DIAGNOSIS — H02831 Dermatochalasis of right upper eyelid: Secondary | ICD-10-CM | POA: Diagnosis not present

## 2017-08-11 DIAGNOSIS — H26493 Other secondary cataract, bilateral: Secondary | ICD-10-CM | POA: Diagnosis not present

## 2017-08-11 DIAGNOSIS — H47323 Drusen of optic disc, bilateral: Secondary | ICD-10-CM | POA: Diagnosis not present

## 2017-09-01 DIAGNOSIS — H9113 Presbycusis, bilateral: Secondary | ICD-10-CM | POA: Diagnosis not present

## 2017-09-01 DIAGNOSIS — Z0000 Encounter for general adult medical examination without abnormal findings: Secondary | ICD-10-CM | POA: Diagnosis not present

## 2017-09-01 DIAGNOSIS — Z79899 Other long term (current) drug therapy: Secondary | ICD-10-CM | POA: Diagnosis not present

## 2017-09-01 DIAGNOSIS — M1990 Unspecified osteoarthritis, unspecified site: Secondary | ICD-10-CM | POA: Diagnosis not present

## 2017-09-01 DIAGNOSIS — E7800 Pure hypercholesterolemia, unspecified: Secondary | ICD-10-CM | POA: Diagnosis not present

## 2017-09-01 DIAGNOSIS — G4700 Insomnia, unspecified: Secondary | ICD-10-CM | POA: Diagnosis not present

## 2017-09-01 DIAGNOSIS — M810 Age-related osteoporosis without current pathological fracture: Secondary | ICD-10-CM | POA: Diagnosis not present

## 2017-09-01 DIAGNOSIS — Z1389 Encounter for screening for other disorder: Secondary | ICD-10-CM | POA: Diagnosis not present

## 2017-09-01 DIAGNOSIS — Z8543 Personal history of malignant neoplasm of ovary: Secondary | ICD-10-CM | POA: Diagnosis not present

## 2017-09-09 DIAGNOSIS — M7741 Metatarsalgia, right foot: Secondary | ICD-10-CM | POA: Diagnosis not present

## 2017-09-09 DIAGNOSIS — M21611 Bunion of right foot: Secondary | ICD-10-CM | POA: Diagnosis not present

## 2017-09-09 DIAGNOSIS — M2041 Other hammer toe(s) (acquired), right foot: Secondary | ICD-10-CM | POA: Diagnosis not present

## 2017-09-10 DIAGNOSIS — R69 Illness, unspecified: Secondary | ICD-10-CM | POA: Diagnosis not present

## 2017-09-11 ENCOUNTER — Other Ambulatory Visit: Payer: Self-pay | Admitting: Orthopedic Surgery

## 2017-09-15 DIAGNOSIS — H02413 Mechanical ptosis of bilateral eyelids: Secondary | ICD-10-CM | POA: Diagnosis not present

## 2017-09-28 DIAGNOSIS — R69 Illness, unspecified: Secondary | ICD-10-CM | POA: Diagnosis not present

## 2017-10-05 DIAGNOSIS — M21611 Bunion of right foot: Secondary | ICD-10-CM | POA: Diagnosis not present

## 2017-10-08 DIAGNOSIS — M810 Age-related osteoporosis without current pathological fracture: Secondary | ICD-10-CM | POA: Diagnosis not present

## 2017-10-08 DIAGNOSIS — Z1231 Encounter for screening mammogram for malignant neoplasm of breast: Secondary | ICD-10-CM | POA: Diagnosis not present

## 2017-10-15 ENCOUNTER — Encounter (HOSPITAL_BASED_OUTPATIENT_CLINIC_OR_DEPARTMENT_OTHER): Payer: Self-pay | Admitting: *Deleted

## 2017-10-15 ENCOUNTER — Other Ambulatory Visit: Payer: Self-pay

## 2017-10-22 ENCOUNTER — Encounter (HOSPITAL_BASED_OUTPATIENT_CLINIC_OR_DEPARTMENT_OTHER)
Admission: RE | Disposition: A | Discharge status: Home / Self Care | Payer: Self-pay | Source: Ambulatory Visit | Attending: Orthopedic Surgery

## 2017-10-22 ENCOUNTER — Encounter (HOSPITAL_BASED_OUTPATIENT_CLINIC_OR_DEPARTMENT_OTHER): Payer: Self-pay | Admitting: Certified Registered"

## 2017-10-22 ENCOUNTER — Ambulatory Visit (HOSPITAL_BASED_OUTPATIENT_CLINIC_OR_DEPARTMENT_OTHER): Payer: Medicare HMO | Admitting: Certified Registered"

## 2017-10-22 ENCOUNTER — Ambulatory Visit (HOSPITAL_BASED_OUTPATIENT_CLINIC_OR_DEPARTMENT_OTHER)
Admission: RE | Admit: 2017-10-22 | Discharge: 2017-10-22 | Disposition: A | Discharge status: Home / Self Care | Payer: Medicare HMO | Source: Ambulatory Visit | Attending: Orthopedic Surgery | Admitting: Orthopedic Surgery

## 2017-10-22 ENCOUNTER — Other Ambulatory Visit: Payer: Self-pay

## 2017-10-22 DIAGNOSIS — Z981 Arthrodesis status: Secondary | ICD-10-CM | POA: Insufficient documentation

## 2017-10-22 DIAGNOSIS — Z8543 Personal history of malignant neoplasm of ovary: Secondary | ICD-10-CM | POA: Diagnosis not present

## 2017-10-22 DIAGNOSIS — M2041 Other hammer toe(s) (acquired), right foot: Secondary | ICD-10-CM | POA: Insufficient documentation

## 2017-10-22 DIAGNOSIS — Z91040 Latex allergy status: Secondary | ICD-10-CM | POA: Insufficient documentation

## 2017-10-22 DIAGNOSIS — Z90710 Acquired absence of both cervix and uterus: Secondary | ICD-10-CM | POA: Insufficient documentation

## 2017-10-22 DIAGNOSIS — Z96652 Presence of left artificial knee joint: Secondary | ICD-10-CM | POA: Diagnosis not present

## 2017-10-22 DIAGNOSIS — Z79899 Other long term (current) drug therapy: Secondary | ICD-10-CM | POA: Diagnosis not present

## 2017-10-22 DIAGNOSIS — M21611 Bunion of right foot: Secondary | ICD-10-CM | POA: Insufficient documentation

## 2017-10-22 DIAGNOSIS — M7741 Metatarsalgia, right foot: Secondary | ICD-10-CM | POA: Diagnosis not present

## 2017-10-22 DIAGNOSIS — G8918 Other acute postprocedural pain: Secondary | ICD-10-CM | POA: Diagnosis not present

## 2017-10-22 DIAGNOSIS — M216X1 Other acquired deformities of right foot: Secondary | ICD-10-CM | POA: Diagnosis not present

## 2017-10-22 HISTORY — DX: Other seasonal allergic rhinitis: J30.2

## 2017-10-22 HISTORY — DX: Malignant (primary) neoplasm, unspecified: C80.1

## 2017-10-22 HISTORY — PX: TARSAL METATARSAL FUSION WITH WEIL OSTEOTOMY: SHX5676

## 2017-10-22 HISTORY — PX: HAMMERTOE RECONSTRUCTION WITH WEIL OSTEOTOMY: SHX5631

## 2017-10-22 SURGERY — TARSAL METATARSAL FUSION WITH WEIL OSTEOTOMY
Anesthesia: General | Site: Foot | Laterality: Right

## 2017-10-22 MED ORDER — CHLORHEXIDINE GLUCONATE 4 % EX LIQD: 60.0000 mL | Freq: Once | CUTANEOUS | Status: DC

## 2017-10-22 MED ORDER — SODIUM CHLORIDE 0.9 % IV SOLN: INTRAVENOUS | Status: DC

## 2017-10-22 MED ORDER — OXYCODONE HCL 5 MG PO TABS: 5.0000 mg | ORAL_TABLET | Freq: Once | ORAL | Status: DC | PRN

## 2017-10-22 MED ORDER — CEFAZOLIN SODIUM-DEXTROSE 2-4 GM/100ML-% IV SOLN
INTRAVENOUS | Status: AC
  Filled 2017-10-22: qty 100

## 2017-10-22 MED ORDER — OXYCODONE HCL 5 MG PO TABS: 5.0000 mg | ORAL_TABLET | ORAL | 0 refills | Status: DC | PRN

## 2017-10-22 MED ORDER — CEFAZOLIN SODIUM-DEXTROSE 2-4 GM/100ML-% IV SOLN
2.0000 g | INTRAVENOUS | Status: AC
  Administered 2017-10-22: 2 g via INTRAVENOUS

## 2017-10-22 MED ORDER — SENNA 8.6 MG PO TABS: 2.0000 | ORAL_TABLET | Freq: Two times a day (BID) | ORAL | 0 refills | Status: DC

## 2017-10-22 MED ORDER — CLONIDINE HCL (ANALGESIA) 100 MCG/ML EP SOLN
EPIDURAL | Status: DC | PRN
  Administered 2017-10-22: 50 ug

## 2017-10-22 MED ORDER — MEPERIDINE HCL 25 MG/ML IJ SOLN: 6.2500 mg | INTRAMUSCULAR | Status: DC | PRN

## 2017-10-22 MED ORDER — PROMETHAZINE HCL 25 MG/ML IJ SOLN: 6.2500 mg | INTRAMUSCULAR | Status: DC | PRN

## 2017-10-22 MED ORDER — LACTATED RINGERS IV SOLN
INTRAVENOUS | Status: DC
  Administered 2017-10-22 (×2): via INTRAVENOUS

## 2017-10-22 MED ORDER — DOCUSATE SODIUM 100 MG PO CAPS: 100.0000 mg | ORAL_CAPSULE | Freq: Two times a day (BID) | ORAL | 0 refills | Status: DC

## 2017-10-22 MED ORDER — ONDANSETRON HCL 4 MG/2ML IJ SOLN
INTRAMUSCULAR | Status: DC | PRN
  Administered 2017-10-22: 4 mg via INTRAVENOUS

## 2017-10-22 MED ORDER — SCOPOLAMINE 1 MG/3DAYS TD PT72: 1.0000 | MEDICATED_PATCH | Freq: Once | TRANSDERMAL | Status: DC | PRN

## 2017-10-22 MED ORDER — PROPOFOL 10 MG/ML IV BOLUS
INTRAVENOUS | Status: DC | PRN
  Administered 2017-10-22: 150 mg via INTRAVENOUS

## 2017-10-22 MED ORDER — LIDOCAINE 2% (20 MG/ML) 5 ML SYRINGE
INTRAMUSCULAR | Status: DC | PRN
  Administered 2017-10-22: 30 mg via INTRAVENOUS

## 2017-10-22 MED ORDER — FENTANYL CITRATE (PF) 100 MCG/2ML IJ SOLN
INTRAMUSCULAR | Status: AC
  Filled 2017-10-22: qty 2

## 2017-10-22 MED ORDER — ROPIVACAINE HCL 7.5 MG/ML IJ SOLN
INTRAMUSCULAR | Status: DC | PRN
  Administered 2017-10-22: 30 mL via PERINEURAL

## 2017-10-22 MED ORDER — KETOROLAC TROMETHAMINE 30 MG/ML IJ SOLN: 30.0000 mg | Freq: Once | INTRAMUSCULAR | Status: DC | PRN

## 2017-10-22 MED ORDER — EPHEDRINE SULFATE 50 MG/ML IJ SOLN
INTRAMUSCULAR | Status: DC | PRN
  Administered 2017-10-22: 10 mg via INTRAVENOUS

## 2017-10-22 MED ORDER — 0.9 % SODIUM CHLORIDE (POUR BTL) OPTIME
TOPICAL | Status: DC | PRN
  Administered 2017-10-22: 250 mL

## 2017-10-22 MED ORDER — DEXAMETHASONE SODIUM PHOSPHATE 10 MG/ML IJ SOLN
INTRAMUSCULAR | Status: DC | PRN
  Administered 2017-10-22: 4 mg via INTRAVENOUS

## 2017-10-22 MED ORDER — MIDAZOLAM HCL 2 MG/2ML IJ SOLN: 1.0000 mg | INTRAMUSCULAR | Status: DC | PRN

## 2017-10-22 MED ORDER — OXYCODONE HCL 5 MG/5ML PO SOLN: 5.0000 mg | Freq: Once | ORAL | Status: DC | PRN

## 2017-10-22 MED ORDER — ASPIRIN EC 81 MG PO TBEC: 81.0000 mg | DELAYED_RELEASE_TABLET | Freq: Two times a day (BID) | ORAL | 0 refills | Status: DC

## 2017-10-22 MED ORDER — FENTANYL CITRATE (PF) 100 MCG/2ML IJ SOLN
50.0000 ug | INTRAMUSCULAR | Status: DC | PRN
  Administered 2017-10-22: 100 ug via INTRAVENOUS

## 2017-10-22 MED ORDER — FENTANYL CITRATE (PF) 100 MCG/2ML IJ SOLN: 25.0000 ug | INTRAMUSCULAR | Status: DC | PRN

## 2017-10-22 SURGICAL SUPPLY — 79 items
BANDAGE ESMARK 6X9 LF (GAUZE/BANDAGES/DRESSINGS) IMPLANT
BIT DRILL 1.8 CANN MAX VPC (BIT) ×2 IMPLANT
BIT DRILL 2.4 AO COUPLING CANN (BIT) ×2 IMPLANT
BLADE AVERAGE 25X9 (BLADE) IMPLANT
BLADE LONG MED 25X9 (BLADE) ×3 IMPLANT
BLADE MICRO SAGITTAL (BLADE) ×3 IMPLANT
BLADE OSC/SAG .038X5.5 CUT EDG (BLADE) ×3 IMPLANT
BLADE SURG 15 STRL LF DISP TIS (BLADE) ×6 IMPLANT
BLADE SURG 15 STRL SS (BLADE) ×9
BNDG CMPR 9X4 STRL LF SNTH (GAUZE/BANDAGES/DRESSINGS)
BNDG CMPR 9X6 STRL LF SNTH (GAUZE/BANDAGES/DRESSINGS)
BNDG COHESIVE 4X5 TAN STRL (GAUZE/BANDAGES/DRESSINGS) ×3 IMPLANT
BNDG COHESIVE 6X5 TAN STRL LF (GAUZE/BANDAGES/DRESSINGS) ×2 IMPLANT
BNDG CONFORM 3 STRL LF (GAUZE/BANDAGES/DRESSINGS) ×3 IMPLANT
BNDG ESMARK 4X9 LF (GAUZE/BANDAGES/DRESSINGS) IMPLANT
BNDG ESMARK 6X9 LF (GAUZE/BANDAGES/DRESSINGS)
CHLORAPREP W/TINT 26ML (MISCELLANEOUS) ×3 IMPLANT
COVER BACK TABLE 60X90IN (DRAPES) ×3 IMPLANT
CUFF TOURNIQUET SINGLE 24IN (TOURNIQUET CUFF) ×2 IMPLANT
CUFF TOURNIQUET SINGLE 34IN LL (TOURNIQUET CUFF) IMPLANT
DRAPE EXTREMITY T 121X128X90 (DRAPE) ×3 IMPLANT
DRAPE OEC MINIVIEW 54X84 (DRAPES) ×3 IMPLANT
DRAPE U-SHAPE 47X51 STRL (DRAPES) ×3 IMPLANT
DRSG MEPITEL 4X7.2 (GAUZE/BANDAGES/DRESSINGS) ×3 IMPLANT
DRSG PAD ABDOMINAL 8X10 ST (GAUZE/BANDAGES/DRESSINGS) ×3 IMPLANT
ELECT REM PT RETURN 9FT ADLT (ELECTROSURGICAL) ×3
ELECTRODE REM PT RTRN 9FT ADLT (ELECTROSURGICAL) ×2 IMPLANT
GAUZE SPONGE 4X4 12PLY STRL (GAUZE/BANDAGES/DRESSINGS) ×3 IMPLANT
GLOVE BIOGEL PI IND STRL 7.0 (GLOVE) ×2 IMPLANT
GLOVE BIOGEL PI IND STRL 8 (GLOVE) ×4 IMPLANT
GLOVE BIOGEL PI INDICATOR 7.0 (GLOVE) ×2
GLOVE BIOGEL PI INDICATOR 8 (GLOVE) ×2
GLOVE SURG SS PI 6.5 STRL IVOR (GLOVE) ×8 IMPLANT
GLOVE SURG SS PI 8.0 STRL IVOR (GLOVE) ×6 IMPLANT
GOWN STRL REUS W/ TWL LRG LVL3 (GOWN DISPOSABLE) ×3 IMPLANT
GOWN STRL REUS W/ TWL XL LVL3 (GOWN DISPOSABLE) ×4 IMPLANT
GOWN STRL REUS W/TWL LRG LVL3 (GOWN DISPOSABLE) ×6
GOWN STRL REUS W/TWL XL LVL3 (GOWN DISPOSABLE) ×6
K-WIRE .054X4 (WIRE) IMPLANT
K-WIRE COCR 0.9X95 (WIRE) ×3
K-WIRE TROC 1.25X150 (WIRE) ×3
KWIRE COCR 0.9X95 (WIRE) ×1 IMPLANT
KWIRE TROC 1.25X150 (WIRE) ×1 IMPLANT
NDL HYPO 25X1 1.5 SAFETY (NEEDLE) IMPLANT
NEEDLE HYPO 22GX1.5 SAFETY (NEEDLE) IMPLANT
NEEDLE HYPO 25X1 1.5 SAFETY (NEEDLE) IMPLANT
NS IRRIG 1000ML POUR BTL (IV SOLUTION) ×3 IMPLANT
PACK BASIN DAY SURGERY FS (CUSTOM PROCEDURE TRAY) ×3 IMPLANT
PAD CAST 4YDX4 CTTN HI CHSV (CAST SUPPLIES) ×2 IMPLANT
PADDING CAST COTTON 4X4 STRL (CAST SUPPLIES) ×3
PADDING CAST COTTON 6X4 STRL (CAST SUPPLIES) ×2 IMPLANT
PENCIL BUTTON HOLSTER BLD 10FT (ELECTRODE) ×3 IMPLANT
SANITIZER HAND PURELL 535ML FO (MISCELLANEOUS) ×3 IMPLANT
SCREW 3.5MM CORT LP 34MM (Screw) ×2 IMPLANT
SCREW CANN PT 4.0X34 (Screw) ×3 IMPLANT
SCREW CANN PT 4X34 NS (Screw) IMPLANT
SCREW HCS TWIST-OFF 2.0X12MM (Screw) ×4 IMPLANT
SCREW MAX VPC  2.5X20 (Screw) ×1 IMPLANT
SCREW MAX VPC 2.5X20 (Screw) ×1 IMPLANT
SHEET MEDIUM DRAPE 40X70 STRL (DRAPES) ×3 IMPLANT
SLEEVE SCD COMPRESS KNEE MED (MISCELLANEOUS) ×3 IMPLANT
SPLINT FAST PLASTER 5X30 (CAST SUPPLIES) ×20
SPLINT PLASTER CAST FAST 5X30 (CAST SUPPLIES) ×20 IMPLANT
SPONGE LAP 18X18 RF (DISPOSABLE) ×3 IMPLANT
STOCKINETTE 6  STRL (DRAPES) ×1
STOCKINETTE 6 STRL (DRAPES) ×2 IMPLANT
SUCTION FRAZIER HANDLE 10FR (MISCELLANEOUS) ×1
SUCTION TUBE FRAZIER 10FR DISP (MISCELLANEOUS) ×2 IMPLANT
SUT ETHILON 3 0 PS 1 (SUTURE) ×5 IMPLANT
SUT MNCRL AB 3-0 PS2 18 (SUTURE) ×3 IMPLANT
SUT VIC AB 0 SH 27 (SUTURE) IMPLANT
SUT VIC AB 2-0 SH 27 (SUTURE) ×3
SUT VIC AB 2-0 SH 27XBRD (SUTURE) ×1 IMPLANT
SUT VICRYL 0 UR6 27IN ABS (SUTURE) IMPLANT
SYR BULB 3OZ (MISCELLANEOUS) ×3 IMPLANT
SYR CONTROL 10ML LL (SYRINGE) IMPLANT
TOWEL OR 17X24 6PK STRL BLUE (TOWEL DISPOSABLE) ×6 IMPLANT
TUBE CONNECTING 20X1/4 (TUBING) ×3 IMPLANT
UNDERPAD 30X30 (UNDERPADS AND DIAPERS) ×3 IMPLANT

## 2017-10-22 NOTE — H&P (Signed)
Angela Garza is an 72 y.o. female.   Chief Complaint: right foot pain HPI: The patient is a 72 year old female without significant past medical history.  She complains of a long history of right forefoot pain due to a bunion and hammertoes.  She has failed nonoperative treatment to date including activity modification, oral anti-inflammatories and shoe wear modification.  She presents now for surgical treatment of this painful forefoot deformity.  Past Medical History:  Diagnosis Date  . Cancer ([place])    ovarian - remission  . Seasonal allergies     Past Surgical History:  Procedure Laterality Date  . ABDOMINAL HYSTERECTOMY    . THUMB FUSION    . TOTAL KNEE ARTHROPLASTY Left 07/14/2008    History reviewed. No pertinent family history. Social History:  reports that she has never smoked. She has never used smokeless tobacco. She reports that she drinks alcohol. She reports that she does not use drugs.  Allergies:  Allergies  Allergen Reactions  . Latex Rash    Medications Prior to Admission  Medication Sig Dispense Refill  . acetaminophen (TYLENOL) 500 MG tablet Take 1,000 mg by mouth 2 (two) times daily.    Marland Kitchen alendronate (FOSAMAX) 70 MG tablet Take 70 mg by mouth once a week. Take with a full glass of water on an empty stomach.    . calcium-vitamin D (OSCAL WITH D) 500-200 MG-UNIT tablet Take 1 tablet by mouth.    . fexofenadine (ALLEGRA) 60 MG tablet Take 60 mg by mouth 2 (two) times daily.    . Multiple Vitamins-Minerals (MULTIVITAMIN WOMEN PO) Take by mouth.    . Omega-3 Fatty Acids (FISH OIL) 1360 MG CAPS Take by mouth.    . simvastatin (ZOCOR) 20 MG tablet Take 20 mg by mouth daily.    Marland Kitchen zolpidem (AMBIEN) 5 MG tablet Take 5 mg by mouth at bedtime as needed for sleep.      No results found for this or any previous visit (from the past 48 hour(s)). No results found.  ROS no recent fever, chills, nausea, vomiting or changes in her appetite  Blood pressure 133/66,  temperature 98 F (36.7 C), temperature source Oral, resp. rate 16, height 5\' 2"  (1.575 m), weight 60.3 kg (133 lb), SpO2 100 %. Physical Exam  Well-nourished well-developed woman in no apparent distress.  Alert and oriented x4.  Mood and affect are normal.  Extraocular motions are intact.  Respirations are unlabored.  Gait is normal.  On standing exam she has a bunion deformity as well as a significant second hammertoe deformity.  Callus beneath the second and third metatarsal heads.  No lymphadenopathy.  Skin is healthy and intact.  Pulses are palpable.  5 out of 5 strength in plantar flexion and dorsi flexion of her toes.  Assessment/Plan Right bunion, metatarsalgia and second hammertoe deformity -to the operating room today for Lapidus arthrodesis of the first tarsometatarsal joint and modified McBride bunionectomy.  She will also need second and third metatarsal Weil osteotomies and second hammertoe correction.  The risks and benefits of the alternative treatment options have been discussed in detail.  The patient wishes to proceed with surgery and specifically understands risks of bleeding, infection, nerve damage, blood clots, need for additional surgery, amputation and death.   Wylene Simmer, MD 11/14/2017, 7:22 AM

## 2017-10-22 NOTE — Anesthesia Procedure Notes (Signed)
Anesthesia Regional Block: Popliteal block   Pre-Anesthetic Checklist: ,, timeout performed, Correct Patient, Correct Site, Correct Laterality, Correct Procedure, Correct Position, site marked, Risks and benefits discussed,  Surgical consent,  Pre-op evaluation,  At surgeon's request and post-op pain management  Laterality: Right  Prep: chloraprep       Needles:  Injection technique: Single-shot  Needle Type: Stimiplex     Needle Length: 10cm  Needle Gauge: 21     Additional Needles:   Procedures:,,,, ultrasound used (permanent image in chart),,,,  Motor weakness within 5 minutes.   Nerve Stimulator or Paresthesia:  Response: Plantar flexion/toe flexion, 0.5 mA,   Additional Responses:   Narrative:  Start time: 10/22/2017 7:24 AM End time: 10/22/2017 7:28 AM Injection made incrementally with aspirations every 5 mL.  Performed by: Personally  Anesthesiologist: Nolon Nations, MD  Additional Notes: Nerve located and needle positioned with direct ultrasound guidance. Good perineural spread. Patient tolerated well.

## 2017-10-22 NOTE — Progress Notes (Signed)
Assisted Dr. Germeroth with right, ultrasound guided, popliteal block. Side rails up, monitors on throughout procedure. See vital signs in flow sheet. Tolerated Procedure well. 

## 2017-10-22 NOTE — Anesthesia Procedure Notes (Signed)
Procedure Name: LMA Insertion Date/Time: 10/22/2017 7:40 AM Performed by: Signe Colt, CRNA Pre-anesthesia Checklist: Patient identified, Emergency Drugs available, Suction available and Patient being monitored Patient Re-evaluated:Patient Re-evaluated prior to induction Oxygen Delivery Method: Circle system utilized Preoxygenation: Pre-oxygenation with 100% oxygen Induction Type: IV induction Ventilation: Mask ventilation without difficulty LMA: LMA inserted LMA Size: 4.0 Number of attempts: 1 Airway Equipment and Method: Bite block Placement Confirmation: positive ETCO2 Tube secured with: Tape Dental Injury: Teeth and Oropharynx as per pre-operative assessment

## 2017-10-22 NOTE — Anesthesia Preprocedure Evaluation (Signed)
Anesthesia Evaluation  Patient identified by MRN, date of birth, ID band Patient awake    Reviewed: Allergy & Precautions, NPO status , Patient's Chart, lab work & pertinent test results  Airway Mallampati: II  TM Distance: >3 FB Neck ROM: Full    Dental no notable dental hx.    Pulmonary neg pulmonary ROS,    Pulmonary exam normal breath sounds clear to auscultation       Cardiovascular negative cardio ROS Normal cardiovascular exam Rhythm:Regular Rate:Normal     Neuro/Psych negative neurological ROS  negative psych ROS   GI/Hepatic negative GI ROS, Neg liver ROS,   Endo/Other  negative endocrine ROS  Renal/GU negative Renal ROS     Musculoskeletal negative musculoskeletal ROS (+)   Abdominal   Peds  Hematology negative hematology ROS (+)   Anesthesia Other Findings   Reproductive/Obstetrics negative OB ROS                             Anesthesia Physical Anesthesia Plan  ASA: II  Anesthesia Plan: General   Post-op Pain Management: GA combined w/ Regional for post-op pain   Induction: Intravenous  PONV Risk Score and Plan: 3 and Ondansetron, Dexamethasone and Treatment may vary due to age or medical condition  Airway Management Planned: LMA  Additional Equipment:   Intra-op Plan:   Post-operative Plan: Extubation in OR  Informed Consent: I have reviewed the patients History and Physical, chart, labs and discussed the procedure including the risks, benefits and alternatives for the proposed anesthesia with the patient or authorized representative who has indicated his/her understanding and acceptance.   Dental advisory given  Plan Discussed with: CRNA  Anesthesia Plan Comments:         Anesthesia Quick Evaluation

## 2017-10-22 NOTE — Discharge Instructions (Addendum)
Angela Hewitt, MD °[facility] ° °Please read the following information regarding your care after surgery. ° °Medications  °You only need a prescription for the narcotic pain medicine (ex. oxycodone, Percocet, Norco).  All of the other medicines listed below are available over the counter. °X Aleve 2 pills twice a day for the first 3 days after surgery. °X acetominophen (Tylenol) 650 mg every 4-6 hours as you need for minor to moderate pain °X oxycodone as prescribed for severe pain ° °Narcotic pain medicine (ex. oxycodone, Percocet, Vicodin) will cause constipation.  To prevent this problem, take the following medicines while you are taking any pain medicine. °X docusate sodium (Colace) 100 mg twice a day X senna (Senokot) 2 tablets twice a day ° °X To help prevent blood clots, take a baby aspirin (81 mg) twice a day after surgery.  You should also get up every hour while you are awake to move around.   ° °Weight Bearing °X Do not bear any weight on the operated leg or foot. ° °Cast / Splint / Dressing °X Keep your splint, cast or dressing clean and dry.  Don’t put anything (coat hanger, pencil, etc) down inside of it.  If it gets damp, use a hair dryer on the cool setting to dry it.  If it gets soaked, call the office to schedule an appointment for a cast change. ° °After your dressing, cast or splint is removed; you may shower, but do not soak or scrub the wound.  Allow the water to run over it, and then gently pat it dry. ° °Swelling °It is normal for you to have swelling where you had surgery.  To reduce swelling and pain, keep your toes above your nose for at least 3 days after surgery.  It may be necessary to keep your foot or leg elevated for several weeks.  If it hurts, it should be elevated. ° °Follow Up °Call my office at [phone] when you are discharged from the hospital or surgery center to schedule an appointment to be seen two weeks after surgery. ° °Call my office at [phone] if you  develop a fever >101.5° F, nausea, vomiting, bleeding from the surgical site or severe pain.   ° ° ° °Post Anesthesia Home Care Instructions ° °Activity: °Get plenty of rest for the remainder of the day. A responsible individual must stay with you for 24 hours following the procedure.  °For the next 24 hours, DO NOT: °-Drive a car °-Operate machinery °-Drink alcoholic beverages °-Take any medication unless instructed by your physician °-Make any legal decisions or sign important papers. ° °Meals: °Start with liquid foods such as gelatin or soup. Progress to regular foods as tolerated. Avoid greasy, spicy, heavy foods. If nausea and/or vomiting occur, drink only clear liquids until the nausea and/or vomiting subsides. Call your physician if vomiting continues. ° °Special Instructions/Symptoms: °Your throat may feel dry or sore from the anesthesia or the breathing tube placed in your throat during surgery. If this causes discomfort, gargle with warm salt water. The discomfort should disappear within 24 hours. ° °If you had a scopolamine patch placed behind your ear for the management of post- operative nausea and/or vomiting: ° °1. The medication in the patch is effective for 72 hours, after which it should be removed.  Wrap patch in a tissue and discard in the trash. Wash hands thoroughly with soap and water. °2. You may remove the patch earlier than 72 hours if you experience unpleasant side   effects which may include dry mouth, dizziness or visual disturbances. °3. Avoid touching the patch. Wash your hands with soap and water after contact with the patch. °  ° ° °Regional Anesthesia Blocks ° °1. Numbness or the inability to move the "blocked" extremity may last from 3-48 hours after placement. The length of time depends on the medication injected and your individual response to the medication. If the numbness is not going away after 48 hours, call your surgeon. ° °2. The extremity that is blocked will need to be  protected until the numbness is gone and the  Strength has returned. Because you cannot feel it, you will need to take extra care to avoid injury. Because it may be weak, you may have difficulty moving it or using it. You may not know what position it is in without looking at it while the block is in effect. ° °3. For blocks in the legs and feet, returning to weight bearing and walking needs to be done carefully. You will need to wait until the numbness is entirely gone and the strength has returned. You should be able to move your leg and foot normally before you try and bear weight or walk. You will need someone to be with you when you first try to ensure you do not fall and possibly risk injury. ° °4. Bruising and tenderness at the needle site are common side effects and will resolve in a few days. ° °5. Persistent numbness or new problems with movement should be communicated to the surgeon or the [facility] ([phone])/ [facility] ([phone]). °

## 2017-10-22 NOTE — Op Note (Signed)
10/22/2017  8:48 AM  PATIENT:  Angela Garza  72 y.o. female  PRE-OPERATIVE DIAGNOSIS:  Right bunion, metatarsalgia and Second hammertoe  POST-OPERATIVE DIAGNOSIS:  same  Procedure(s): 1.  Right modified McBride bunionectomy 2.  Right Lapidus first tarsometatarsal joint arthrodesis (separate incision) 3.  Right second metatarsal Weil osteotomy (separate incision) 4.  Right third metatarsal Weil osteotomy (separate incision) 5.  Right second hammertoe correction through a separate incision 6.  Right foot AP, oblique and lateral radiographs  SURGEON:  Wylene Simmer, MD  ASSISTANT: Mechele Claude, PA-C  ANESTHESIA:   General, regional  EBL:  minimal   TOURNIQUET:   Total Tourniquet Time Documented: Thigh (Right) - 48 minutes Total: Thigh (Right) - 48 minutes  COMPLICATIONS:  None apparent  DISPOSITION:  Extubated, awake and stable to recovery.  INDICATION FOR PROCEDURE: The patient is a 72 year old female without significant past medical history.  She has a long history of right forefoot pain due to a bunion, metatarsalgia and a second hammertoe deformity.  She has failed nonoperative treatment and presents today for surgery.  She understands the risks and benefits of the alternative treatment options and elects surgical treatment.  The risks and benefits of the alternative treatment options have been discussed in detail.  The patient wishes to proceed with surgery and specifically understands risks of bleeding, infection, nerve damage, blood clots, need for additional surgery, amputation and death.  PROCEDURE IN DETAIL:  After pre operative consent was obtained, and the correct operative site was identified, the patient was brought to the operating room and placed supine on the OR table.  Anesthesia was administered.  Pre-operative antibiotics were administered.  A surgical timeout was taken.  The right lower extremity was prepped and draped in standard sterile fashion with a  tourniquet around the thigh.  The extremity was elevated and the tourniquet was inflated to 250 mill meters of mercury.  A longitudinal incision was made over the dorsum of the first webspace.  Dissection was carried down through the subcutaneous tissues.  The intermetatarsal ligament was divided under direct vision.  A small arthrotomy was made between the lateral sesamoid and the metatarsal head.  Several perforations were made in the lateral joint capsule.  The hallux could then be positioned in 20 degrees of varus passively.  Attention was turned to the medial midfoot.  A longitudinal incision was made over the medial eminence.  Dissection was carried down through the subcutaneous tissues.  Medial joint capsule was incised and elevated plantarly and dorsally.  Medial eminence was resected in line with the first metatarsal shaft.  Attention was then turned to the dorsum of the foot where a longitudinal incision was made over the first tarsometatarsal joint.  Dissection was carried down through the subcutaneous tissues.  The extensor tendons were protected.  The joint capsule was incised.  The remaining articular cartilage and subchondral bone from the first tarsometatarsal joint was removed with the oscillating saw.  The cuts were beveled to take more bone laterally than medially in order to correct the intermetatarsal angle.  The wound was irrigated copiously.  The joint was reduced and provisionally pinned.  Radiographs showed appropriate correction of the bunion deformity.  The joint was then fixed with a 4 mm partially threaded cannulated screw from [employer] as well as a fully threaded 3.5 mm LPS screw.  Attention was then turned to the second MTP joint.  A longitudinal incision was made and dissection was carried down to the subcutaneous tissues.  The extensor tendons were protected.  The joint capsule was incised and elevated.  A Weil osteotomy was cut with the oscillating saw.  The head of the  metatarsal was allowed to retract proximally and it was fixed with a 2 mm [employer] FRS screw.  Same procedure was then performed through separate incision for the third metatarsal head.  Attention was then turned to the second toe where a transverse incision was made over the PIP joint.  Dissection was carried down through the subtenons tissues and extensor mechanism.  The head of the possible phalanx was excised followed by the base of the middle phalanx.  The joint was reduced and fixed with a 2.5 mm [employer] VPC screw.  Final AP oblique and lateral radiographs showed appropriate position and length of all hardware in appropriate correction of the forefoot deformities.  The wounds were irrigated copiously and closed with Vicryl Monocryl and nylon.  Sterile dressings were applied followed by a well-padded short leg splint.  The tourniquet was released after application of the dressings.  The patient was awakened from anesthesia and transported to the recovery room in stable condition.   FOLLOW UP PLAN: Nonweightbearing on the right lower extremity.  Follow-up in the office in 2 weeks for suture removal and conversion to a short leg cast.  No toe spacer.  Aspirin 81 mg p.o. twice daily for DVT prophylaxis.   RADIOGRAPHS: AP, oblique and lateral radiographs of the right foot show interval correction of bunion and second hammertoe deformities.  Hardware is appropriately positioned and of the appropriate lengths.  No other acute injuries are noted.    Mechele Claude PA-C was present and scrubbed for the duration of the operative case. His assistance was essential in positioning the patient, prepping and draping, gaining and maintaining exposure, performing the operation, closing and dressing the wounds and applying the splint.

## 2017-10-22 NOTE — Transfer of Care (Signed)
Immediate Anesthesia Transfer of Care Note  Patient: SHAYDEN GINGRICH  Procedure(s) Performed: Right Lapidus First Tarsometatarsal Arthrodesis; Right Modified McBride Bunionectomy (Right Foot) 2-3 Weil Osteotomies; Second Hammertoe Correction (Right Foot)  Patient Location: PACU  Anesthesia Type:GA combined with regional for post-op pain  Level of Consciousness: awake and patient cooperative  Airway & Oxygen Therapy: Patient Spontanous Breathing and Patient connected to face mask oxygen  Post-op Assessment: Report given to RN and Post -op Vital signs reviewed and stable  Post vital signs: Reviewed and stable  Last Vitals:  Vitals Value Taken Time  BP    Temp    Pulse 70 10/22/2017  8:46 AM  Resp    SpO2 98 % 10/22/2017  8:46 AM  Vitals shown include unvalidated device data.  Last Pain:  Vitals:   10/22/17 0640  TempSrc: Oral  PainSc: 0-No pain         Complications: No apparent anesthesia complications

## 2017-10-23 ENCOUNTER — Encounter (HOSPITAL_BASED_OUTPATIENT_CLINIC_OR_DEPARTMENT_OTHER): Payer: Self-pay | Admitting: Orthopedic Surgery

## 2017-10-25 NOTE — Anesthesia Postprocedure Evaluation (Addendum)
Anesthesia Post Note  Patient: Angela Garza  Procedure(s) Performed: Right Lapidus First Tarsometatarsal Arthrodesis; Right Modified McBride Bunionectomy (Right Foot) 2-3 Weil Osteotomies; Second Hammertoe Correction (Right Foot)     Patient location during evaluation: PACU Anesthesia Type: General Level of consciousness: sedated and patient cooperative Pain management: pain level controlled Vital Signs Assessment: post-procedure vital signs reviewed and stable Respiratory status: spontaneous breathing Cardiovascular status: stable Anesthetic complications: no    Last Vitals:  Vitals:   10/22/17 0915 10/22/17 1000  BP: (P) 116/80 132/73  Pulse:  73  Resp:  20  Temp:  36.4 C  SpO2:  100%    Last Pain:  Vitals:   10/22/17 1000  TempSrc: Oral                 Nolon Nations

## 2017-10-26 ENCOUNTER — Encounter (HOSPITAL_COMMUNITY): Payer: Self-pay | Admitting: Emergency Medicine

## 2017-10-26 ENCOUNTER — Other Ambulatory Visit: Payer: Self-pay

## 2017-10-26 ENCOUNTER — Emergency Department (HOSPITAL_COMMUNITY): Payer: Medicare HMO

## 2017-10-26 ENCOUNTER — Emergency Department (HOSPITAL_COMMUNITY)
Admission: EM | Admit: 2017-10-26 | Discharge: 2017-10-26 | Disposition: A | Discharge status: Home / Self Care | Payer: Medicare HMO | Attending: Emergency Medicine | Admitting: Emergency Medicine

## 2017-10-26 DIAGNOSIS — K5903 Drug induced constipation: Secondary | ICD-10-CM

## 2017-10-26 DIAGNOSIS — K8020 Calculus of gallbladder without cholecystitis without obstruction: Secondary | ICD-10-CM | POA: Diagnosis not present

## 2017-10-26 DIAGNOSIS — Z96652 Presence of left artificial knee joint: Secondary | ICD-10-CM | POA: Insufficient documentation

## 2017-10-26 DIAGNOSIS — Z79899 Other long term (current) drug therapy: Secondary | ICD-10-CM | POA: Diagnosis not present

## 2017-10-26 DIAGNOSIS — Z7982 Long term (current) use of aspirin: Secondary | ICD-10-CM | POA: Diagnosis not present

## 2017-10-26 DIAGNOSIS — T402X5A Adverse effect of other opioids, initial encounter: Secondary | ICD-10-CM | POA: Diagnosis not present

## 2017-10-26 DIAGNOSIS — J302 Other seasonal allergic rhinitis: Secondary | ICD-10-CM | POA: Diagnosis not present

## 2017-10-26 DIAGNOSIS — K5900 Constipation, unspecified: Secondary | ICD-10-CM | POA: Diagnosis present

## 2017-10-26 LAB — URINALYSIS, ROUTINE W REFLEX MICROSCOPIC
Bilirubin Urine: NEGATIVE
Glucose, UA: NEGATIVE mg/dL
Hgb urine dipstick: NEGATIVE
Ketones, ur: NEGATIVE mg/dL
Leukocytes, UA: NEGATIVE
Nitrite: NEGATIVE
Protein, ur: NEGATIVE mg/dL
Specific Gravity, Urine: 1.011 (ref 1.005–1.030)
pH: 8 (ref 5.0–8.0)

## 2017-10-26 LAB — COMPREHENSIVE METABOLIC PANEL
ALT: 18 U/L (ref 14–54)
AST: 30 U/L (ref 15–41)
Albumin: 4.2 g/dL (ref 3.5–5.0)
Alkaline Phosphatase: 54 U/L (ref 38–126)
Anion gap: 11 (ref 5–15)
BUN: 14 mg/dL (ref 6–20)
CO2: 24 mmol/L (ref 22–32)
Calcium: 9.4 mg/dL (ref 8.9–10.3)
Chloride: 103 mmol/L (ref 101–111)
Creatinine, Ser: 0.85 mg/dL (ref 0.44–1.00)
GFR calc Af Amer: >60 mL/min (ref >60)
GFR calc non Af Amer: >60 mL/min (ref >60)
Glucose, Bld: 120 mg/dL — ABNORMAL HIGH (ref 65–99)
Potassium: 4.4 mmol/L (ref 3.5–5.1)
Sodium: 138 mmol/L (ref 135–145)
Total Bilirubin: 0.3 mg/dL (ref 0.3–1.2)
Total Protein: 7.4 g/dL (ref 6.5–8.1)

## 2017-10-26 LAB — CBC WITH DIFFERENTIAL/PLATELET
Basophils Absolute: 0 10*3/uL (ref 0.0–0.1)
Basophils Relative: 0 %
Eosinophils Absolute: 0.1 10*3/uL (ref 0.0–0.7)
Eosinophils Relative: 0 %
HCT: 41.3 % (ref 36.0–46.0)
Hemoglobin: 13.9 g/dL (ref 12.0–15.0)
Lymphocytes Relative: 7 %
Lymphs Abs: 1.1 10*3/uL (ref 0.7–4.0)
MCH: 31.8 pg (ref 26.0–34.0)
MCHC: 33.7 g/dL (ref 30.0–36.0)
MCV: 94.5 fL (ref 78.0–100.0)
Monocytes Absolute: 1.1 10*3/uL — ABNORMAL HIGH (ref 0.1–1.0)
Monocytes Relative: 7 %
Neutro Abs: 13.3 10*3/uL — ABNORMAL HIGH (ref 1.7–7.7)
Neutrophils Relative %: 86 %
Platelets: 291 10*3/uL (ref 150–400)
RBC: 4.37 MIL/uL (ref 3.87–5.11)
RDW: 13.2 % (ref 11.5–15.5)
WBC: 15.6 10*3/uL — ABNORMAL HIGH (ref 4.0–10.5)

## 2017-10-26 MED ORDER — BISACODYL 10 MG RE SUPP
10.0000 mg | Freq: Once | RECTAL | Status: AC
  Administered 2017-10-26: 10 mg via RECTAL
  Filled 2017-10-26: qty 1

## 2017-10-26 MED ORDER — BISACODYL 10 MG RE SUPP: 10.0000 mg | RECTAL | 0 refills | Status: DC | PRN

## 2017-10-26 MED ORDER — POLYETHYLENE GLYCOL 3350 17 G PO PACK: 17.0000 g | PACK | Freq: Every day | ORAL | 0 refills | Status: DC

## 2017-10-26 MED ORDER — POLYETHYLENE GLYCOL 3350 17 G PO PACK
17.0000 g | PACK | Freq: Every day | ORAL | Status: DC
  Administered 2017-10-26: 17 g via ORAL
  Filled 2017-10-26: qty 1

## 2017-10-26 NOTE — Discharge Instructions (Signed)
Take MiraLAX and Dulcolax suppository daily.  You can take MiraLAX 2-3 times daily to help you produce bowel movement.  Stop taking that frequently after he had produced a bowel movement.  Continue taking your at home stool softener.  Make sure to drink plenty of fluids.  Avoid taking oxycodone and other narcotic pain medication.  Please follow-up with your doctor in 1-2 days for recheck.  Please return to the emergency department if you develop any new or worsening symptoms.

## 2017-10-26 NOTE — ED Notes (Signed)
RN placed pt on bedpan, pt did not void or have BM.

## 2017-10-26 NOTE — ED Provider Notes (Signed)
[facility] EMERGENCY DEPARTMENT Provider Note   CSN: [id number] Arrival date & time: 10/26/17  0457     History   Chief Complaint Chief Complaint  Patient presents with  . Constipation    HPI Angela Garza is a 72 y.o. female with history of ovarian cancer in remission, bunionectomy on 4/11 who presents with constipation.  Patient reports she has been taking oxycodone at home for her pain.  In addition, she has been taking Colace and Senokot as well as 2 doses of milk of magnesia.  She has not been able to have any bowel movement at all since her surgery.  She has had decreased urination over the past 24 hours.  She had some nausea and one episode of vomiting this morning.  She denies any significant abdominal pain other than pressure.  She denies any fevers, chest pain, shortness of breath.  She denies any other complications related to her surgery.  HPI  Past Medical History:  Diagnosis Date  . Cancer (Home)    ovarian - remission  . Seasonal allergies     There are no active problems to display for this patient.   Past Surgical History:  Procedure Laterality Date  . ABDOMINAL HYSTERECTOMY    . HAMMERTOE RECONSTRUCTION WITH WEIL OSTEOTOMY Right 10/22/2017   Procedure: 2-3 Malka So Osteotomies; Second Hammertoe Correction;  Surgeon: Wylene Simmer, MD;  Location: [facility];  Service: Orthopedics;  Laterality: Right;  . TARSAL METATARSAL FUSION WITH WEIL OSTEOTOMY Right 10/22/2017   Procedure: Right Lapidus First Tarsometatarsal Arthrodesis; Right Modified Alphonzo Grieve;  Surgeon: Wylene Simmer, MD;  Location: [facility];  Service: Orthopedics;  Laterality: Right;  . THUMB FUSION    . TOTAL KNEE ARTHROPLASTY Left 07/14/2008     OB History   None      Home Medications    Prior to Admission medications   Medication Sig Start Date End Date Taking? Authorizing Provider  acetaminophen (TYLENOL) 500 MG tablet Take 1,000 mg  by mouth every 6 (six) hours as needed for mild pain.    Yes [provider]  alendronate (FOSAMAX) 70 MG tablet Take 70 mg by mouth once a week. Take with a full glass of water on an empty stomach.   Yes [provider]  aspirin EC 81 MG tablet Take 1 tablet (81 mg total) by mouth 2 (two) times daily. 10/22/17  Yes Corky Sing, PA-C  calcium-vitamin D (OSCAL WITH D) 500-200 MG-UNIT tablet Take 1 tablet by mouth 2 (two) times daily.    Yes [provider]  docusate sodium (COLACE) 100 MG capsule Take 1 capsule (100 mg total) by mouth 2 (two) times daily. While taking narcotic pain medicine. 10/22/17  Yes Corky Sing, PA-C  fexofenadine (ALLEGRA) 60 MG tablet Take 60 mg by mouth every morning.    Yes [provider]  Multiple Vitamins-Minerals (MULTIVITAMIN WOMEN PO) Take 1 tablet by mouth.    Yes [provider]  Omega-3 Fatty Acids (FISH OIL) 1360 MG CAPS Take 1 capsule by mouth daily.    Yes [provider]  oxyCODONE (ROXICODONE) 5 MG immediate release tablet Take 1 tablet (5 mg total) by mouth every 4 (four) hours as needed for moderate pain or severe pain. For no more than 5 days. 10/22/17  Yes Corky Sing, PA-C  senna (SENOKOT) 8.6 MG TABS tablet Take 2 tablets (17.2 mg total) by mouth 2 (two) times daily. 10/22/17  Yes  Corky Sing, PA-C  simvastatin (ZOCOR) 20 MG tablet Take 20 mg by mouth daily.   Yes [provider]  zolpidem (AMBIEN) 5 MG tablet Take 5 mg by mouth at bedtime as needed for sleep.   Yes [provider]  bisacodyl (DULCOLAX) 10 MG suppository Place 1 suppository (10 mg total) rectally as needed for moderate constipation. 10/26/17   Antia Rahal, Bea Graff, PA-C  polyethylene glycol (MIRALAX) packet Take 17 g by mouth daily. 10/26/17   Frederica Kuster, PA-C    Family History No family history on file.  Social History Social History   Tobacco Use  . Smoking status: Never Smoker  .  Smokeless tobacco: Never Used  Substance Use Topics  . Alcohol use: Yes    Frequency: Never    Comment: wine  . Drug use: Never     Allergies   Latex   Review of Systems Review of Systems  Constitutional: Negative for chills and fever.  HENT: Negative for facial swelling and sore throat.   Respiratory: Negative for shortness of breath.   Cardiovascular: Negative for chest pain.  Gastrointestinal: Positive for constipation, nausea and vomiting. Negative for abdominal pain.  Genitourinary: Positive for decreased urine volume. Negative for dysuria.  Musculoskeletal: Negative for back pain.  Skin: Negative for rash and wound.  Neurological: Negative for headaches.  Psychiatric/Behavioral: The patient is not nervous/anxious.      Physical Exam Updated Vital Signs BP (!) 159/58   Pulse 83   Temp 98.6 F (37 C) (Oral)   Resp 16   SpO2 93%   Physical Exam  Constitutional: She appears well-developed and well-nourished. No distress.  HENT:  Head: Normocephalic and atraumatic.  Mouth/Throat: Oropharynx is clear and moist. No oropharyngeal exudate.  Eyes: Pupils are equal, round, and reactive to light. Conjunctivae are normal. Right eye exhibits no discharge. Left eye exhibits no discharge. No scleral icterus.  Neck: Normal range of motion. Neck supple. No thyromegaly present.  Cardiovascular: Normal rate, regular rhythm, normal heart sounds and intact distal pulses. Exam reveals no gallop and no friction rub.  No murmur heard. Pulmonary/Chest: Effort normal and breath sounds normal. No stridor. No respiratory distress. She has no wheezes. She has no rales.  Abdominal: Soft. Bowel sounds are normal. She exhibits no distension. There is tenderness (mild) in the right lower quadrant, suprapubic area and left lower quadrant. There is no rebound and no guarding.  Genitourinary: Rectal exam shows anal tone normal.  Genitourinary Comments: Hard stool felt on DRE, unable to obtain much  with disimpaction, however able to break some of the large piece up  Musculoskeletal: She exhibits no edema.  Splint on R foot  Lymphadenopathy:    She has no cervical adenopathy.  Neurological: She is alert. Coordination normal.  Skin: Skin is warm and dry. No rash noted. She is not diaphoretic. No pallor.  Psychiatric: She has a normal mood and affect.  Nursing note and vitals reviewed.    ED Treatments / Results  Labs (all labs ordered are listed, but only abnormal results are displayed) Labs Reviewed  URINALYSIS, ROUTINE W REFLEX MICROSCOPIC - Abnormal; Notable for the following components:      Result Value   APPearance CLOUDY (*)    All other components within normal limits  COMPREHENSIVE METABOLIC PANEL - Abnormal; Notable for the following components:   Glucose, Bld 120 (*)    All other components within normal limits  CBC WITH DIFFERENTIAL/PLATELET - Abnormal; Notable for the following  components:   WBC 15.6 (*)    Neutro Abs 13.3 (*)    Monocytes Absolute 1.1 (*)    All other components within normal limits    EKG None  Radiology Dg Abdomen 1 View  Result Date: 10/26/2017 CLINICAL DATA:  Acute onset of constipation. EXAM: ABDOMEN - 1 VIEW COMPARISON:  CT of the abdomen and pelvis from 05/11/2017 FINDINGS: The visualized bowel gas pattern is unremarkable. Scattered air and stool filled loops of colon are seen; no abnormal dilatation of small bowel loops is seen to suggest small bowel obstruction. No free intra-abdominal air is identified, though evaluation for free air is limited on a single supine view. Stones are noted within the gallbladder. Postoperative change is noted about the right side of the abdomen. The visualized osseous structures are within normal limits; the sacroiliac joints are unremarkable in appearance. Mild degenerative change is noted at the lower lumbar spine. IMPRESSION: 1. Unremarkable bowel gas pattern; no free intra-abdominal air seen. 2.  Cholelithiasis. Electronically Signed   By: Garald Balding M.D.   On: 10/26/2017 06:43    Procedures Procedures (including critical care time)  Medications Ordered in ED Medications  polyethylene glycol (MIRALAX / GLYCOLAX) packet 17 g (17 g Oral Given 10/26/17 0916)  bisacodyl (DULCOLAX) suppository 10 mg (10 mg Rectal Given 10/26/17 0751)     Initial Impression / Assessment and Plan / ED Course  I have reviewed the triage vital signs and the nursing notes.  Pertinent labs & imaging results that were available during my care of the patient were reviewed by me and considered in my medical decision making (see chart for details).     Patient with constipation after opioid use following foot surgery. Patient is well appearing, tolerating oral fluids. CBC shows mild leukocytosis, 15.6.  CMP within normal limits.  No significant abdominal tenderness.  UA is negative.  Patient did have some post void residual (528cc on bladder scan with 100cc output), however she states her OB/GYN told her she has hesitancy with retention.  KUB shows no free air, unremarkable bowel gas pattern.  Patient given MiraLAX and Dulcolax suppository in the ED and will discharge home with same.  Patient advised to avoid her oxycodone.  Advised to increase fluid intake.  Recheck at PCP in 1-2 days.  Return precautions discussed.  Patient understands and agrees with plan.  Patient vitals stable and discharged in satisfactory condition.  Patient also evaluated by Dr. Ashok Cordia who guided the patient's management and agrees with plan.  Final Clinical Impressions(s) / ED Diagnoses   Final diagnoses:  Constipation due to opioid therapy    ED Discharge Orders        Ordered    bisacodyl (DULCOLAX) 10 MG suppository  As needed     10/26/17 1006    polyethylene glycol [facility]) packet  Daily     10/26/17 [address], [place] 10/26/17 1029    Lajean Saver, MD 10/26/17 1401

## 2017-10-26 NOTE — ED Triage Notes (Signed)
Pt presents with c/o inability to have BM x 5 days, pt is 5 days post op L foot surgery, pt has not had BM since that time, pt has been taking Oxycodone regularly, she has taken Colace and MOM x 2 without relief.

## 2017-10-26 NOTE — ED Notes (Signed)
Pt was placed on the bedpan, tolerated well. Pt had 156mL output, nurse and PA was notified.

## 2017-10-26 NOTE — ED Notes (Signed)
Patient transported to X-ray 

## 2017-10-26 NOTE — ED Notes (Signed)
Bladder scanned pt monitor read 564mL. Nurse was notified.

## 2017-11-06 DIAGNOSIS — Z4789 Encounter for other orthopedic aftercare: Secondary | ICD-10-CM | POA: Diagnosis not present

## 2017-11-23 DIAGNOSIS — H02413 Mechanical ptosis of bilateral eyelids: Secondary | ICD-10-CM | POA: Diagnosis not present

## 2017-11-23 DIAGNOSIS — Z01818 Encounter for other preprocedural examination: Secondary | ICD-10-CM | POA: Diagnosis not present

## 2017-12-04 DIAGNOSIS — M2011 Hallux valgus (acquired), right foot: Secondary | ICD-10-CM | POA: Diagnosis not present

## 2017-12-04 DIAGNOSIS — Z4789 Encounter for other orthopedic aftercare: Secondary | ICD-10-CM | POA: Diagnosis not present

## 2017-12-04 DIAGNOSIS — M79671 Pain in right foot: Secondary | ICD-10-CM | POA: Diagnosis not present

## 2018-01-01 DIAGNOSIS — M79671 Pain in right foot: Secondary | ICD-10-CM | POA: Diagnosis not present

## 2018-01-01 DIAGNOSIS — M2011 Hallux valgus (acquired), right foot: Secondary | ICD-10-CM | POA: Diagnosis not present

## 2018-01-01 DIAGNOSIS — M21611 Bunion of right foot: Secondary | ICD-10-CM | POA: Diagnosis not present

## 2018-01-01 DIAGNOSIS — M21619 Bunion of unspecified foot: Secondary | ICD-10-CM | POA: Diagnosis not present

## 2018-01-01 DIAGNOSIS — Z4789 Encounter for other orthopedic aftercare: Secondary | ICD-10-CM | POA: Diagnosis not present

## 2018-01-18 DIAGNOSIS — T753XXA Motion sickness, initial encounter: Secondary | ICD-10-CM | POA: Diagnosis not present

## 2018-01-18 DIAGNOSIS — T148XXA Other injury of unspecified body region, initial encounter: Secondary | ICD-10-CM | POA: Diagnosis not present

## 2018-01-18 DIAGNOSIS — R58 Hemorrhage, not elsewhere classified: Secondary | ICD-10-CM | POA: Diagnosis not present

## 2018-02-01 DIAGNOSIS — M21612 Bunion of left foot: Secondary | ICD-10-CM | POA: Diagnosis not present

## 2018-02-01 DIAGNOSIS — M79671 Pain in right foot: Secondary | ICD-10-CM | POA: Diagnosis not present

## 2018-03-11 DIAGNOSIS — R69 Illness, unspecified: Secondary | ICD-10-CM | POA: Diagnosis not present

## 2018-03-27 DIAGNOSIS — R69 Illness, unspecified: Secondary | ICD-10-CM | POA: Diagnosis not present

## 2018-05-03 DIAGNOSIS — H02834 Dermatochalasis of left upper eyelid: Secondary | ICD-10-CM | POA: Diagnosis not present

## 2018-05-03 DIAGNOSIS — H02831 Dermatochalasis of right upper eyelid: Secondary | ICD-10-CM | POA: Diagnosis not present

## 2018-06-15 DIAGNOSIS — Z01818 Encounter for other preprocedural examination: Secondary | ICD-10-CM | POA: Diagnosis not present

## 2018-06-15 DIAGNOSIS — H0289 Other specified disorders of eyelid: Secondary | ICD-10-CM | POA: Diagnosis not present

## 2018-06-15 DIAGNOSIS — H02831 Dermatochalasis of right upper eyelid: Secondary | ICD-10-CM | POA: Diagnosis not present

## 2018-06-15 DIAGNOSIS — H02834 Dermatochalasis of left upper eyelid: Secondary | ICD-10-CM | POA: Diagnosis not present

## 2018-09-10 DIAGNOSIS — H47323 Drusen of optic disc, bilateral: Secondary | ICD-10-CM | POA: Diagnosis not present

## 2018-09-14 DIAGNOSIS — R69 Illness, unspecified: Secondary | ICD-10-CM | POA: Diagnosis not present

## 2018-09-20 DIAGNOSIS — R69 Illness, unspecified: Secondary | ICD-10-CM | POA: Diagnosis not present

## 2018-09-30 DIAGNOSIS — Z6824 Body mass index (BMI) 24.0-24.9, adult: Secondary | ICD-10-CM | POA: Diagnosis not present

## 2018-09-30 DIAGNOSIS — H9113 Presbycusis, bilateral: Secondary | ICD-10-CM | POA: Diagnosis not present

## 2018-09-30 DIAGNOSIS — E7800 Pure hypercholesterolemia, unspecified: Secondary | ICD-10-CM | POA: Diagnosis not present

## 2018-09-30 DIAGNOSIS — G4700 Insomnia, unspecified: Secondary | ICD-10-CM | POA: Diagnosis not present

## 2018-09-30 DIAGNOSIS — M810 Age-related osteoporosis without current pathological fracture: Secondary | ICD-10-CM | POA: Diagnosis not present

## 2018-09-30 DIAGNOSIS — Z79899 Other long term (current) drug therapy: Secondary | ICD-10-CM | POA: Diagnosis not present

## 2018-09-30 DIAGNOSIS — Z0000 Encounter for general adult medical examination without abnormal findings: Secondary | ICD-10-CM | POA: Diagnosis not present

## 2018-09-30 DIAGNOSIS — Z8543 Personal history of malignant neoplasm of ovary: Secondary | ICD-10-CM | POA: Diagnosis not present

## 2018-11-02 DIAGNOSIS — M25561 Pain in right knee: Secondary | ICD-10-CM | POA: Diagnosis not present

## 2018-11-02 DIAGNOSIS — M1711 Unilateral primary osteoarthritis, right knee: Secondary | ICD-10-CM | POA: Diagnosis not present

## 2018-11-29 DIAGNOSIS — M1710 Unilateral primary osteoarthritis, unspecified knee: Secondary | ICD-10-CM | POA: Diagnosis not present

## 2018-11-29 DIAGNOSIS — I868 Varicose veins of other specified sites: Secondary | ICD-10-CM | POA: Diagnosis not present

## 2018-12-02 DIAGNOSIS — Z803 Family history of malignant neoplasm of breast: Secondary | ICD-10-CM | POA: Diagnosis not present

## 2018-12-02 DIAGNOSIS — Z1231 Encounter for screening mammogram for malignant neoplasm of breast: Secondary | ICD-10-CM | POA: Diagnosis not present

## 2018-12-30 DIAGNOSIS — M25561 Pain in right knee: Secondary | ICD-10-CM | POA: Diagnosis not present

## 2018-12-30 DIAGNOSIS — M1711 Unilateral primary osteoarthritis, right knee: Secondary | ICD-10-CM | POA: Diagnosis not present

## 2019-01-31 DIAGNOSIS — M1710 Unilateral primary osteoarthritis, unspecified knee: Secondary | ICD-10-CM | POA: Diagnosis not present

## 2019-01-31 DIAGNOSIS — Z6824 Body mass index (BMI) 24.0-24.9, adult: Secondary | ICD-10-CM | POA: Diagnosis not present

## 2019-01-31 DIAGNOSIS — E7800 Pure hypercholesterolemia, unspecified: Secondary | ICD-10-CM | POA: Diagnosis not present

## 2019-01-31 DIAGNOSIS — R69 Illness, unspecified: Secondary | ICD-10-CM | POA: Diagnosis not present

## 2019-01-31 DIAGNOSIS — M810 Age-related osteoporosis without current pathological fracture: Secondary | ICD-10-CM | POA: Diagnosis not present

## 2019-02-03 DIAGNOSIS — M25561 Pain in right knee: Secondary | ICD-10-CM | POA: Diagnosis not present

## 2019-02-03 DIAGNOSIS — M1711 Unilateral primary osteoarthritis, right knee: Secondary | ICD-10-CM | POA: Diagnosis not present

## 2019-02-23 DIAGNOSIS — M1711 Unilateral primary osteoarthritis, right knee: Secondary | ICD-10-CM | POA: Diagnosis not present

## 2019-02-24 DIAGNOSIS — R262 Difficulty in walking, not elsewhere classified: Secondary | ICD-10-CM | POA: Diagnosis not present

## 2019-02-24 DIAGNOSIS — Z471 Aftercare following joint replacement surgery: Secondary | ICD-10-CM | POA: Diagnosis not present

## 2019-02-24 DIAGNOSIS — M6281 Muscle weakness (generalized): Secondary | ICD-10-CM | POA: Diagnosis not present

## 2019-02-24 DIAGNOSIS — M1711 Unilateral primary osteoarthritis, right knee: Secondary | ICD-10-CM | POA: Diagnosis not present

## 2019-02-28 DIAGNOSIS — R262 Difficulty in walking, not elsewhere classified: Secondary | ICD-10-CM | POA: Diagnosis not present

## 2019-02-28 DIAGNOSIS — Z471 Aftercare following joint replacement surgery: Secondary | ICD-10-CM | POA: Diagnosis not present

## 2019-02-28 DIAGNOSIS — M1711 Unilateral primary osteoarthritis, right knee: Secondary | ICD-10-CM | POA: Diagnosis not present

## 2019-02-28 DIAGNOSIS — M6281 Muscle weakness (generalized): Secondary | ICD-10-CM | POA: Diagnosis not present

## 2019-03-02 DIAGNOSIS — M6281 Muscle weakness (generalized): Secondary | ICD-10-CM | POA: Diagnosis not present

## 2019-03-02 DIAGNOSIS — M1711 Unilateral primary osteoarthritis, right knee: Secondary | ICD-10-CM | POA: Diagnosis not present

## 2019-03-02 DIAGNOSIS — Z471 Aftercare following joint replacement surgery: Secondary | ICD-10-CM | POA: Diagnosis not present

## 2019-03-02 DIAGNOSIS — R262 Difficulty in walking, not elsewhere classified: Secondary | ICD-10-CM | POA: Diagnosis not present

## 2019-03-04 DIAGNOSIS — M6281 Muscle weakness (generalized): Secondary | ICD-10-CM | POA: Diagnosis not present

## 2019-03-04 DIAGNOSIS — Z471 Aftercare following joint replacement surgery: Secondary | ICD-10-CM | POA: Diagnosis not present

## 2019-03-04 DIAGNOSIS — R262 Difficulty in walking, not elsewhere classified: Secondary | ICD-10-CM | POA: Diagnosis not present

## 2019-03-04 DIAGNOSIS — M1711 Unilateral primary osteoarthritis, right knee: Secondary | ICD-10-CM | POA: Diagnosis not present

## 2019-03-07 DIAGNOSIS — M25561 Pain in right knee: Secondary | ICD-10-CM | POA: Diagnosis not present

## 2019-03-07 DIAGNOSIS — R262 Difficulty in walking, not elsewhere classified: Secondary | ICD-10-CM | POA: Diagnosis not present

## 2019-03-07 DIAGNOSIS — M1711 Unilateral primary osteoarthritis, right knee: Secondary | ICD-10-CM | POA: Diagnosis not present

## 2019-03-07 DIAGNOSIS — M6281 Muscle weakness (generalized): Secondary | ICD-10-CM | POA: Diagnosis not present

## 2019-03-07 DIAGNOSIS — Z471 Aftercare following joint replacement surgery: Secondary | ICD-10-CM | POA: Diagnosis not present

## 2019-03-09 DIAGNOSIS — R21 Rash and other nonspecific skin eruption: Secondary | ICD-10-CM | POA: Diagnosis not present

## 2019-03-10 DIAGNOSIS — M6281 Muscle weakness (generalized): Secondary | ICD-10-CM | POA: Diagnosis not present

## 2019-03-10 DIAGNOSIS — Z471 Aftercare following joint replacement surgery: Secondary | ICD-10-CM | POA: Diagnosis not present

## 2019-03-10 DIAGNOSIS — M1711 Unilateral primary osteoarthritis, right knee: Secondary | ICD-10-CM | POA: Diagnosis not present

## 2019-03-10 DIAGNOSIS — R262 Difficulty in walking, not elsewhere classified: Secondary | ICD-10-CM | POA: Diagnosis not present

## 2019-03-11 DIAGNOSIS — L299 Pruritus, unspecified: Secondary | ICD-10-CM | POA: Diagnosis not present

## 2019-03-11 DIAGNOSIS — L509 Urticaria, unspecified: Secondary | ICD-10-CM | POA: Diagnosis not present

## 2019-03-14 DIAGNOSIS — M6281 Muscle weakness (generalized): Secondary | ICD-10-CM | POA: Diagnosis not present

## 2019-03-14 DIAGNOSIS — M1711 Unilateral primary osteoarthritis, right knee: Secondary | ICD-10-CM | POA: Diagnosis not present

## 2019-03-14 DIAGNOSIS — R262 Difficulty in walking, not elsewhere classified: Secondary | ICD-10-CM | POA: Diagnosis not present

## 2019-03-14 DIAGNOSIS — Z471 Aftercare following joint replacement surgery: Secondary | ICD-10-CM | POA: Diagnosis not present

## 2019-03-16 DIAGNOSIS — M6281 Muscle weakness (generalized): Secondary | ICD-10-CM | POA: Diagnosis not present

## 2019-03-16 DIAGNOSIS — R262 Difficulty in walking, not elsewhere classified: Secondary | ICD-10-CM | POA: Diagnosis not present

## 2019-03-16 DIAGNOSIS — M1711 Unilateral primary osteoarthritis, right knee: Secondary | ICD-10-CM | POA: Diagnosis not present

## 2019-03-16 DIAGNOSIS — Z471 Aftercare following joint replacement surgery: Secondary | ICD-10-CM | POA: Diagnosis not present

## 2019-03-18 DIAGNOSIS — M6281 Muscle weakness (generalized): Secondary | ICD-10-CM | POA: Diagnosis not present

## 2019-03-18 DIAGNOSIS — Z471 Aftercare following joint replacement surgery: Secondary | ICD-10-CM | POA: Diagnosis not present

## 2019-03-18 DIAGNOSIS — M1711 Unilateral primary osteoarthritis, right knee: Secondary | ICD-10-CM | POA: Diagnosis not present

## 2019-03-18 DIAGNOSIS — R262 Difficulty in walking, not elsewhere classified: Secondary | ICD-10-CM | POA: Diagnosis not present

## 2019-03-21 DIAGNOSIS — M6281 Muscle weakness (generalized): Secondary | ICD-10-CM | POA: Diagnosis not present

## 2019-03-21 DIAGNOSIS — M1711 Unilateral primary osteoarthritis, right knee: Secondary | ICD-10-CM | POA: Diagnosis not present

## 2019-03-21 DIAGNOSIS — R262 Difficulty in walking, not elsewhere classified: Secondary | ICD-10-CM | POA: Diagnosis not present

## 2019-03-21 DIAGNOSIS — Z471 Aftercare following joint replacement surgery: Secondary | ICD-10-CM | POA: Diagnosis not present

## 2019-03-22 DIAGNOSIS — M25561 Pain in right knee: Secondary | ICD-10-CM | POA: Diagnosis not present

## 2019-03-23 DIAGNOSIS — R262 Difficulty in walking, not elsewhere classified: Secondary | ICD-10-CM | POA: Diagnosis not present

## 2019-03-23 DIAGNOSIS — M1711 Unilateral primary osteoarthritis, right knee: Secondary | ICD-10-CM | POA: Diagnosis not present

## 2019-03-23 DIAGNOSIS — M6281 Muscle weakness (generalized): Secondary | ICD-10-CM | POA: Diagnosis not present

## 2019-03-23 DIAGNOSIS — Z471 Aftercare following joint replacement surgery: Secondary | ICD-10-CM | POA: Diagnosis not present

## 2019-03-25 DIAGNOSIS — M1711 Unilateral primary osteoarthritis, right knee: Secondary | ICD-10-CM | POA: Diagnosis not present

## 2019-03-25 DIAGNOSIS — M6281 Muscle weakness (generalized): Secondary | ICD-10-CM | POA: Diagnosis not present

## 2019-03-25 DIAGNOSIS — R262 Difficulty in walking, not elsewhere classified: Secondary | ICD-10-CM | POA: Diagnosis not present

## 2019-03-25 DIAGNOSIS — Z471 Aftercare following joint replacement surgery: Secondary | ICD-10-CM | POA: Diagnosis not present

## 2019-03-28 DIAGNOSIS — M1711 Unilateral primary osteoarthritis, right knee: Secondary | ICD-10-CM | POA: Diagnosis not present

## 2019-03-28 DIAGNOSIS — Z471 Aftercare following joint replacement surgery: Secondary | ICD-10-CM | POA: Diagnosis not present

## 2019-03-28 DIAGNOSIS — R262 Difficulty in walking, not elsewhere classified: Secondary | ICD-10-CM | POA: Diagnosis not present

## 2019-03-28 DIAGNOSIS — M6281 Muscle weakness (generalized): Secondary | ICD-10-CM | POA: Diagnosis not present

## 2019-03-30 DIAGNOSIS — R262 Difficulty in walking, not elsewhere classified: Secondary | ICD-10-CM | POA: Diagnosis not present

## 2019-03-30 DIAGNOSIS — M1711 Unilateral primary osteoarthritis, right knee: Secondary | ICD-10-CM | POA: Diagnosis not present

## 2019-03-30 DIAGNOSIS — M6281 Muscle weakness (generalized): Secondary | ICD-10-CM | POA: Diagnosis not present

## 2019-03-30 DIAGNOSIS — Z471 Aftercare following joint replacement surgery: Secondary | ICD-10-CM | POA: Diagnosis not present

## 2019-03-31 DIAGNOSIS — R69 Illness, unspecified: Secondary | ICD-10-CM | POA: Diagnosis not present

## 2019-05-31 DIAGNOSIS — R69 Illness, unspecified: Secondary | ICD-10-CM | POA: Diagnosis not present

## 2019-07-21 DIAGNOSIS — Z96651 Presence of right artificial knee joint: Secondary | ICD-10-CM | POA: Diagnosis not present

## 2019-07-21 DIAGNOSIS — M25561 Pain in right knee: Secondary | ICD-10-CM | POA: Diagnosis not present

## 2019-08-03 IMAGING — DX DG ABDOMEN 1V
1 series · 1 of 1 positions shown · non-contrast
Comparison: CT of the abdomen and pelvis from 05/11/2017

CLINICAL DATA: Acute onset of constipation.

EXAM:
ABDOMEN - 1 VIEW

[abdomen kub]
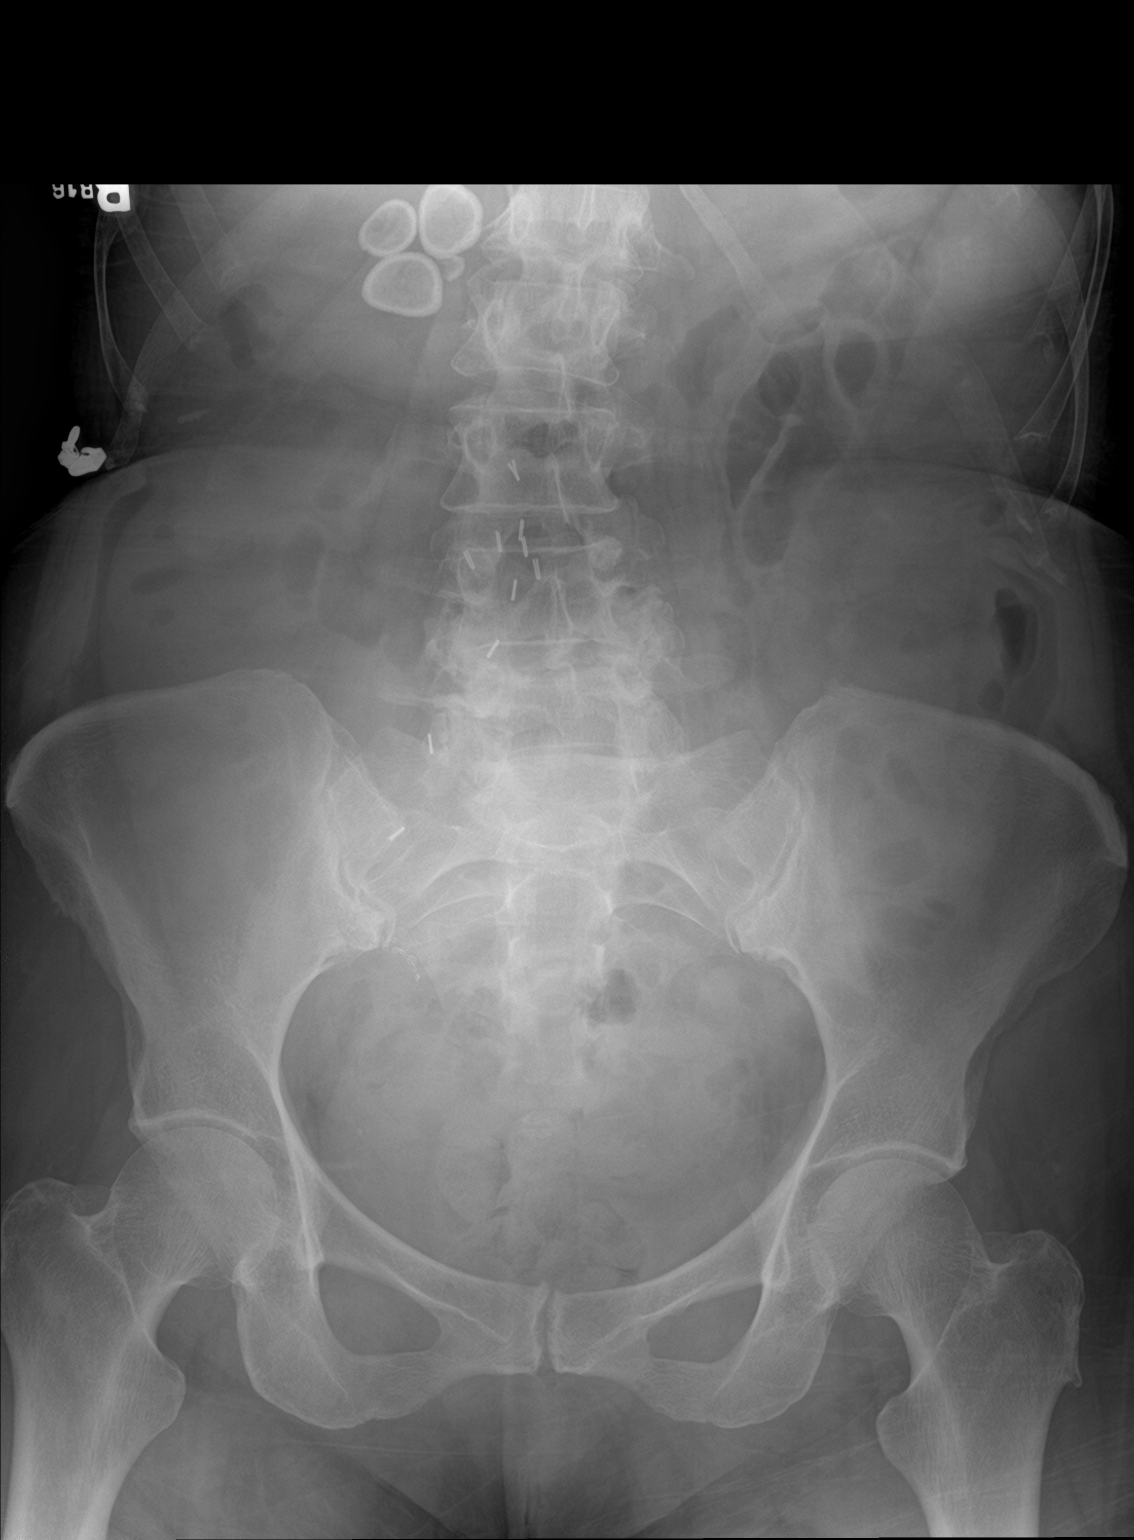

[1 of 1 positions shown; findings below may reference images not displayed]

FINDINGS: The visualized bowel gas pattern is unremarkable. Scattered air and
stool filled loops of colon are seen; no abnormal dilatation of
small bowel loops is seen to suggest small bowel obstruction. No
free intra-abdominal air is identified, though evaluation for free
air is limited on a single supine view.

Stones are noted within the gallbladder. Postoperative change is
noted about the right side of the abdomen.

The visualized osseous structures are within normal limits; the
sacroiliac joints are unremarkable in appearance. Mild degenerative
change is noted at the lower lumbar spine.
IMPRESSION: 1. Unremarkable bowel gas pattern; no free intra-abdominal air seen.
2. Cholelithiasis.

## 2019-10-06 DIAGNOSIS — H47323 Drusen of optic disc, bilateral: Secondary | ICD-10-CM | POA: Diagnosis not present

## 2019-11-29 DIAGNOSIS — R69 Illness, unspecified: Secondary | ICD-10-CM | POA: Diagnosis not present

## 2019-12-05 DIAGNOSIS — M810 Age-related osteoporosis without current pathological fracture: Secondary | ICD-10-CM | POA: Diagnosis not present

## 2019-12-05 DIAGNOSIS — M8588 Other specified disorders of bone density and structure, other site: Secondary | ICD-10-CM | POA: Diagnosis not present

## 2019-12-05 DIAGNOSIS — Z1231 Encounter for screening mammogram for malignant neoplasm of breast: Secondary | ICD-10-CM | POA: Diagnosis not present

## 2019-12-06 DIAGNOSIS — Z6824 Body mass index (BMI) 24.0-24.9, adult: Secondary | ICD-10-CM | POA: Diagnosis not present

## 2019-12-06 DIAGNOSIS — G4700 Insomnia, unspecified: Secondary | ICD-10-CM | POA: Diagnosis not present

## 2019-12-06 DIAGNOSIS — M1990 Unspecified osteoarthritis, unspecified site: Secondary | ICD-10-CM | POA: Diagnosis not present

## 2019-12-06 DIAGNOSIS — Z8543 Personal history of malignant neoplasm of ovary: Secondary | ICD-10-CM | POA: Diagnosis not present

## 2019-12-06 DIAGNOSIS — Z79899 Other long term (current) drug therapy: Secondary | ICD-10-CM | POA: Diagnosis not present

## 2019-12-06 DIAGNOSIS — Z0000 Encounter for general adult medical examination without abnormal findings: Secondary | ICD-10-CM | POA: Diagnosis not present

## 2019-12-06 DIAGNOSIS — Z800 Family history of malignant neoplasm of digestive organs: Secondary | ICD-10-CM | POA: Diagnosis not present

## 2019-12-06 DIAGNOSIS — H9113 Presbycusis, bilateral: Secondary | ICD-10-CM | POA: Diagnosis not present

## 2019-12-06 DIAGNOSIS — M810 Age-related osteoporosis without current pathological fracture: Secondary | ICD-10-CM | POA: Diagnosis not present

## 2019-12-06 DIAGNOSIS — E7800 Pure hypercholesterolemia, unspecified: Secondary | ICD-10-CM | POA: Diagnosis not present

## 2019-12-09 ENCOUNTER — Encounter: Payer: Self-pay | Admitting: Gastroenterology

## 2019-12-13 DIAGNOSIS — R922 Inconclusive mammogram: Secondary | ICD-10-CM | POA: Diagnosis not present

## 2019-12-13 DIAGNOSIS — R928 Other abnormal and inconclusive findings on diagnostic imaging of breast: Secondary | ICD-10-CM | POA: Diagnosis not present

## 2019-12-13 DIAGNOSIS — R590 Localized enlarged lymph nodes: Secondary | ICD-10-CM | POA: Diagnosis not present

## 2019-12-26 DIAGNOSIS — R590 Localized enlarged lymph nodes: Secondary | ICD-10-CM | POA: Diagnosis not present

## 2019-12-27 DIAGNOSIS — J302 Other seasonal allergic rhinitis: Secondary | ICD-10-CM | POA: Diagnosis not present

## 2019-12-27 DIAGNOSIS — G4700 Insomnia, unspecified: Secondary | ICD-10-CM | POA: Diagnosis not present

## 2019-12-27 DIAGNOSIS — Z809 Family history of malignant neoplasm, unspecified: Secondary | ICD-10-CM | POA: Diagnosis not present

## 2019-12-27 DIAGNOSIS — Z7983 Long term (current) use of bisphosphonates: Secondary | ICD-10-CM | POA: Diagnosis not present

## 2019-12-27 DIAGNOSIS — E785 Hyperlipidemia, unspecified: Secondary | ICD-10-CM | POA: Diagnosis not present

## 2019-12-27 DIAGNOSIS — M1990 Unspecified osteoarthritis, unspecified site: Secondary | ICD-10-CM | POA: Diagnosis not present

## 2019-12-27 DIAGNOSIS — R030 Elevated blood-pressure reading, without diagnosis of hypertension: Secondary | ICD-10-CM | POA: Diagnosis not present

## 2019-12-27 DIAGNOSIS — Z7722 Contact with and (suspected) exposure to environmental tobacco smoke (acute) (chronic): Secondary | ICD-10-CM | POA: Diagnosis not present

## 2019-12-27 DIAGNOSIS — M810 Age-related osteoporosis without current pathological fracture: Secondary | ICD-10-CM | POA: Diagnosis not present

## 2019-12-27 DIAGNOSIS — Z8249 Family history of ischemic heart disease and other diseases of the circulatory system: Secondary | ICD-10-CM | POA: Diagnosis not present

## 2020-01-04 DIAGNOSIS — Z0100 Encounter for examination of eyes and vision without abnormal findings: Secondary | ICD-10-CM | POA: Diagnosis not present

## 2020-01-05 DIAGNOSIS — R69 Illness, unspecified: Secondary | ICD-10-CM | POA: Diagnosis not present

## 2020-01-30 DIAGNOSIS — M25561 Pain in right knee: Secondary | ICD-10-CM | POA: Diagnosis not present

## 2020-01-30 DIAGNOSIS — Z96651 Presence of right artificial knee joint: Secondary | ICD-10-CM | POA: Diagnosis not present

## 2020-02-09 ENCOUNTER — Encounter: Payer: Self-pay | Admitting: Gastroenterology

## 2020-02-09 ENCOUNTER — Other Ambulatory Visit: Payer: Self-pay

## 2020-02-09 ENCOUNTER — Ambulatory Visit (AMBULATORY_SURGERY_CENTER): Payer: Self-pay | Admitting: *Deleted

## 2020-02-09 VITALS — Ht 61.0 in | Wt 139.2 lb

## 2020-02-09 DIAGNOSIS — Z800 Family history of malignant neoplasm of digestive organs: Secondary | ICD-10-CM

## 2020-02-09 NOTE — Progress Notes (Signed)
Patient denies any allergies to egg or soy products. Patient denies complications with anesthesia/sedation.  Patient denies oxygen use at home and denies diet medications. Emmi instructions for colonoscopy explained and given to patient.  Patient had both covid vaccinations, last one on 08/23/19.

## 2020-02-10 ENCOUNTER — Telehealth: Payer: Self-pay | Admitting: Gastroenterology

## 2020-02-10 NOTE — Telephone Encounter (Signed)
Pt is requesting a call back from a nurse to discuss her probiotic.

## 2020-02-10 NOTE — Telephone Encounter (Signed)
Pt called wanting to know if Probiotic is in her med list? I told her it is listed in her med list. She stated that's all she wanted to know.

## 2020-02-13 DIAGNOSIS — H0011 Chalazion right upper eyelid: Secondary | ICD-10-CM | POA: Diagnosis not present

## 2020-02-23 ENCOUNTER — Ambulatory Visit (AMBULATORY_SURGERY_CENTER): Payer: Medicare HMO | Admitting: Gastroenterology

## 2020-02-23 ENCOUNTER — Encounter: Payer: Self-pay | Admitting: Gastroenterology

## 2020-02-23 ENCOUNTER — Other Ambulatory Visit: Payer: Self-pay

## 2020-02-23 VITALS — BP 134/59 | HR 59 | Temp 97.1°F | Resp 17 | Ht 61.0 in | Wt 139.0 lb

## 2020-02-23 DIAGNOSIS — Z800 Family history of malignant neoplasm of digestive organs: Secondary | ICD-10-CM | POA: Diagnosis not present

## 2020-02-23 DIAGNOSIS — Z1211 Encounter for screening for malignant neoplasm of colon: Secondary | ICD-10-CM | POA: Diagnosis not present

## 2020-02-23 DIAGNOSIS — Z86010 Personal history of colonic polyps: Secondary | ICD-10-CM | POA: Diagnosis not present

## 2020-02-23 DIAGNOSIS — D120 Benign neoplasm of cecum: Secondary | ICD-10-CM

## 2020-02-23 DIAGNOSIS — E7800 Pure hypercholesterolemia, unspecified: Secondary | ICD-10-CM | POA: Diagnosis not present

## 2020-02-23 MED ORDER — SODIUM CHLORIDE 0.9 % IV SOLN: 500.0000 mL | Freq: Once | INTRAVENOUS | Status: DC

## 2020-02-23 NOTE — Progress Notes (Signed)
Report given to PACU, vss 

## 2020-02-23 NOTE — Discharge Instructions (Signed)
Resume previous medications. Handouts on findings given to patient. (Diverticulosis. Hemorrhoids.)  Await pathology for final recommendations.   YOU HAD AN ENDOSCOPIC PROCEDURE TODAY AT [facility]:   Refer to the procedure report that was given to you for any specific questions about what was found during the examination.  If the procedure report does not answer your questions, please call your gastroenterologist to clarify.  If you requested that your care partner not be given the details of your procedure findings, then the procedure report has been included in a sealed envelope for you to review at your convenience later.  YOU SHOULD EXPECT: Some feelings of bloating in the abdomen. Passage of more gas than usual.  Walking can help get rid of the air that was put into your GI tract during the procedure and reduce the bloating. If you had a lower endoscopy (such as a colonoscopy or flexible sigmoidoscopy) you may notice spotting of blood in your stool or on the toilet paper. If you underwent a bowel prep for your procedure, you may not have a normal bowel movement for a few days.  Please Note:  You might notice some irritation and congestion in your nose or some drainage.  This is from the oxygen used during your procedure.  There is no need for concern and it should clear up in a day or so.  SYMPTOMS TO REPORT IMMEDIATELY:   Following lower endoscopy (colonoscopy or flexible sigmoidoscopy):  Excessive amounts of blood in the stool  Significant tenderness or worsening of abdominal pains  Swelling of the abdomen that is new, acute  Fever of 100F or higher   For urgent or emergent issues, a gastroenterologist can be reached at any hour by calling [phone]. Do not use MyChart messaging for urgent concerns.    DIET:  We do recommend a small meal at first, but then you may proceed to your regular diet.  Drink plenty of fluids but you should avoid alcoholic beverages for  24 hours.  ACTIVITY:  You should plan to take it easy for the rest of today and you should NOT DRIVE or use heavy machinery until tomorrow (because of the sedation medicines used during the test).     FOLLOW UP: Our staff will call the number listed on your records 48-72 hours following your procedure to check on you and address any questions or concerns that you may have regarding the information given to you following your procedure. If we do not reach you, we will leave a message.  We will attempt to reach you two times.  During this call, we will ask if you have developed any symptoms of COVID 19. If you develop any symptoms (ie: fever, flu-like symptoms, shortness of breath, cough etc.) before then, please call [phone].  If you test positive for Covid 19 in the 2 weeks post procedure, please call and report this information to [place].    If any biopsies were taken you will be contacted by phone or by letter within the next 1-3 weeks.  Please call us at [phone] if you have not heard about the biopsies in 3 weeks.    SIGNATURES/CONFIDENTIALITY: You and/or your care partner have signed paperwork which will be entered into your electronic medical record.  These signatures attest to the fact that that the information above on your After Visit Summary has been reviewed and is understood.  Full responsibility of the confidentiality of this discharge information lies with you and/or  your care-partner. 

## 2020-02-23 NOTE — Progress Notes (Signed)
VS by [facility].

## 2020-02-23 NOTE — Op Note (Signed)
[facility] Patient Name: Angela Garza Procedure Date: 02/23/2020 9:33 AM MRN: [id number] Endoscopist: Jackquline Denmark , MD Age: 74 Referring MD:  Date of Birth: [birth year] Gender: Female Account #: [id number] Procedure:                Colonoscopy Indications:              Screening in patient at increased risk: Colorectal                            cancer in father before age 5 Medicines:                Monitored Anesthesia Care Procedure:                Pre-Anesthesia Assessment:                           - Prior to the procedure, a History and Physical                            was performed, and patient medications and                            allergies were reviewed. The patient's tolerance of                            previous anesthesia was also reviewed. The risks                            and benefits of the procedure and the sedation                            options and risks were discussed with the patient.                            All questions were answered, and informed consent                            was obtained. Prior Anticoagulants: The patient has                            taken no previous anticoagulant or antiplatelet                            agents. ASA Grade Assessment: II - A patient with                            mild systemic disease. After reviewing the risks                            and benefits, the patient was deemed in                            satisfactory condition to undergo the procedure.  After obtaining informed consent, the colonoscope                            was passed under direct vision. Throughout the                            procedure, the patient's blood pressure, pulse, and                            oxygen saturations were monitored continuously. The                            Colonoscope was introduced through the anus and                            advanced to the 2 cm into the  ileum. The                            colonoscopy was performed without difficulty. The                            patient tolerated the procedure well. The quality                            of the bowel preparation was good. The terminal                            ileum, ileocecal valve, appendiceal orifice, and                            rectum were photographed. Scope In: 9:42:59 AM Scope Out: 9:59:23 AM Scope Withdrawal Time: 0 hours 12 minutes 20 seconds  Total Procedure Duration: 0 hours 16 minutes 24 seconds  Findings:                 A 6 mm polyp was found in the cecum. The polyp was                            sessile. The polyp was removed with a cold snare.                            Resection and retrieval were complete.                           Multiple small-mouthed diverticula were found in                            the sigmoid colon and rare in ascending colon.                           Non-bleeding internal hemorrhoids were found during                            retroflexion. The hemorrhoids were small.  The terminal ileum appeared normal.                           The exam was otherwise without abnormality on                            direct and retroflexion views. The colon was                            redundant. Complications:            No immediate complications. Estimated Blood Loss:     Estimated blood loss: none. Impression:               -Small colonic polyp s/p polypectomy.                           -Moderate predominantly sigmoid diverticulosis.                           -Otherwise normal colonoscopy to TI. The colon was                            redundant. Recommendation:           - Patient has a contact number available for                            emergencies. The signs and symptoms of potential                            delayed complications were discussed with the                            patient. Return to normal  activities tomorrow.                            Written discharge instructions were provided to the                            patient.                           - Resume previous high-fiber diet.                           - Continue present medications.                           - Await pathology results.                           - Repeat colonoscopy for surveillance based on                            pathology results.                           -  The findings and recommendations were discussed                            with the patient's family. Jackquline Denmark, MD 02/23/2020 10:04:08 AM This report has been signed electronically.

## 2020-02-23 NOTE — Progress Notes (Signed)
Called to room to assist during endoscopic procedure.  Patient ID and intended procedure confirmed with present staff. Received instructions for my participation in the procedure from the performing physician.  

## 2020-02-27 ENCOUNTER — Telehealth: Payer: Self-pay

## 2020-02-27 NOTE — Telephone Encounter (Signed)
  Follow up Call-  Call back number 02/23/2020  Post procedure Call Back phone  # [phone]  Permission to leave phone message Yes  Some recent data might be hidden     Patient questions:  Do you have a fever, pain , or abdominal swelling? No. Pain Score  0 *  Have you tolerated food without any problems? Yes.    Have you been able to return to your normal activities? Yes.    Do you have any questions about your discharge instructions: Diet   No. Medications  No. Follow up visit  No.  Do you have questions or concerns about your Care? No.  Actions: * If pain score is 4 or above: No action needed, pain <4.  1. Have you developed a fever since your procedure? no  2.   Have you had an respiratory symptoms (SOB or cough) since your procedure? no  3.   Have you tested positive for COVID 19 since your procedure no  4.   Have you had any family members/close contacts diagnosed with the COVID 19 since your procedure?  no   If yes to any of these questions please route to Joylene John, RN and Joella Prince, RN

## 2020-02-28 ENCOUNTER — Encounter: Payer: Self-pay | Admitting: Gastroenterology

## 2020-03-21 DIAGNOSIS — L821 Other seborrheic keratosis: Secondary | ICD-10-CM | POA: Diagnosis not present

## 2020-03-21 DIAGNOSIS — L219 Seborrheic dermatitis, unspecified: Secondary | ICD-10-CM | POA: Diagnosis not present

## 2020-03-21 DIAGNOSIS — L578 Other skin changes due to chronic exposure to nonionizing radiation: Secondary | ICD-10-CM | POA: Diagnosis not present

## 2020-03-21 DIAGNOSIS — L820 Inflamed seborrheic keratosis: Secondary | ICD-10-CM | POA: Diagnosis not present

## 2020-03-31 DIAGNOSIS — R69 Illness, unspecified: Secondary | ICD-10-CM | POA: Diagnosis not present

## 2020-05-31 DIAGNOSIS — R69 Illness, unspecified: Secondary | ICD-10-CM | POA: Diagnosis not present

## 2020-06-01 DIAGNOSIS — H0015 Chalazion left lower eyelid: Secondary | ICD-10-CM | POA: Diagnosis not present

## 2020-10-01 DIAGNOSIS — M1990 Unspecified osteoarthritis, unspecified site: Secondary | ICD-10-CM | POA: Diagnosis not present

## 2020-10-01 DIAGNOSIS — G4700 Insomnia, unspecified: Secondary | ICD-10-CM | POA: Diagnosis not present

## 2020-10-01 DIAGNOSIS — M1710 Unilateral primary osteoarthritis, unspecified knee: Secondary | ICD-10-CM | POA: Diagnosis not present

## 2020-10-01 DIAGNOSIS — E7800 Pure hypercholesterolemia, unspecified: Secondary | ICD-10-CM | POA: Diagnosis not present

## 2020-10-01 DIAGNOSIS — M810 Age-related osteoporosis without current pathological fracture: Secondary | ICD-10-CM | POA: Diagnosis not present

## 2020-10-05 DIAGNOSIS — H47323 Drusen of optic disc, bilateral: Secondary | ICD-10-CM | POA: Diagnosis not present

## 2020-12-20 DIAGNOSIS — H903 Sensorineural hearing loss, bilateral: Secondary | ICD-10-CM | POA: Diagnosis not present

## 2020-12-26 DIAGNOSIS — L578 Other skin changes due to chronic exposure to nonionizing radiation: Secondary | ICD-10-CM | POA: Diagnosis not present

## 2020-12-26 DIAGNOSIS — L739 Follicular disorder, unspecified: Secondary | ICD-10-CM | POA: Diagnosis not present

## 2020-12-26 DIAGNOSIS — D1801 Hemangioma of skin and subcutaneous tissue: Secondary | ICD-10-CM | POA: Diagnosis not present

## 2020-12-26 DIAGNOSIS — L820 Inflamed seborrheic keratosis: Secondary | ICD-10-CM | POA: Diagnosis not present

## 2020-12-27 DIAGNOSIS — Z1231 Encounter for screening mammogram for malignant neoplasm of breast: Secondary | ICD-10-CM | POA: Diagnosis not present

## 2021-01-01 DIAGNOSIS — M1710 Unilateral primary osteoarthritis, unspecified knee: Secondary | ICD-10-CM | POA: Diagnosis not present

## 2021-01-01 DIAGNOSIS — E7800 Pure hypercholesterolemia, unspecified: Secondary | ICD-10-CM | POA: Diagnosis not present

## 2021-01-01 DIAGNOSIS — M1990 Unspecified osteoarthritis, unspecified site: Secondary | ICD-10-CM | POA: Diagnosis not present

## 2021-01-01 DIAGNOSIS — M810 Age-related osteoporosis without current pathological fracture: Secondary | ICD-10-CM | POA: Diagnosis not present

## 2021-01-01 DIAGNOSIS — G4700 Insomnia, unspecified: Secondary | ICD-10-CM | POA: Diagnosis not present

## 2021-01-04 DIAGNOSIS — Z8543 Personal history of malignant neoplasm of ovary: Secondary | ICD-10-CM | POA: Diagnosis not present

## 2021-01-04 DIAGNOSIS — E7800 Pure hypercholesterolemia, unspecified: Secondary | ICD-10-CM | POA: Diagnosis not present

## 2021-01-04 DIAGNOSIS — M79673 Pain in unspecified foot: Secondary | ICD-10-CM | POA: Diagnosis not present

## 2021-01-09 DIAGNOSIS — M79672 Pain in left foot: Secondary | ICD-10-CM | POA: Diagnosis not present

## 2021-01-09 DIAGNOSIS — M810 Age-related osteoporosis without current pathological fracture: Secondary | ICD-10-CM | POA: Diagnosis not present

## 2021-01-09 DIAGNOSIS — E7800 Pure hypercholesterolemia, unspecified: Secondary | ICD-10-CM | POA: Diagnosis not present

## 2021-01-09 DIAGNOSIS — T63481D Toxic effect of venom of other arthropod, accidental (unintentional), subsequent encounter: Secondary | ICD-10-CM | POA: Diagnosis not present

## 2021-01-09 DIAGNOSIS — Z1389 Encounter for screening for other disorder: Secondary | ICD-10-CM | POA: Diagnosis not present

## 2021-01-09 DIAGNOSIS — G4700 Insomnia, unspecified: Secondary | ICD-10-CM | POA: Diagnosis not present

## 2021-01-09 DIAGNOSIS — Z0000 Encounter for general adult medical examination without abnormal findings: Secondary | ICD-10-CM | POA: Diagnosis not present

## 2021-01-16 DIAGNOSIS — M19079 Primary osteoarthritis, unspecified ankle and foot: Secondary | ICD-10-CM | POA: Diagnosis not present

## 2021-01-16 DIAGNOSIS — M21612 Bunion of left foot: Secondary | ICD-10-CM | POA: Diagnosis not present

## 2021-01-16 DIAGNOSIS — M25572 Pain in left ankle and joints of left foot: Secondary | ICD-10-CM | POA: Diagnosis not present

## 2021-01-16 DIAGNOSIS — M79672 Pain in left foot: Secondary | ICD-10-CM | POA: Diagnosis not present

## 2021-02-14 DIAGNOSIS — Z20822 Contact with and (suspected) exposure to covid-19: Secondary | ICD-10-CM | POA: Diagnosis not present

## 2021-02-14 DIAGNOSIS — Z03818 Encounter for observation for suspected exposure to other biological agents ruled out: Secondary | ICD-10-CM | POA: Diagnosis not present

## 2021-04-19 DIAGNOSIS — M1710 Unilateral primary osteoarthritis, unspecified knee: Secondary | ICD-10-CM | POA: Diagnosis not present

## 2021-04-19 DIAGNOSIS — M810 Age-related osteoporosis without current pathological fracture: Secondary | ICD-10-CM | POA: Diagnosis not present

## 2021-04-19 DIAGNOSIS — E7800 Pure hypercholesterolemia, unspecified: Secondary | ICD-10-CM | POA: Diagnosis not present

## 2021-04-19 DIAGNOSIS — M1990 Unspecified osteoarthritis, unspecified site: Secondary | ICD-10-CM | POA: Diagnosis not present

## 2021-04-19 DIAGNOSIS — G4700 Insomnia, unspecified: Secondary | ICD-10-CM | POA: Diagnosis not present

## 2021-09-05 DIAGNOSIS — Z79899 Other long term (current) drug therapy: Secondary | ICD-10-CM | POA: Diagnosis not present

## 2021-09-05 DIAGNOSIS — M25572 Pain in left ankle and joints of left foot: Secondary | ICD-10-CM | POA: Diagnosis not present

## 2021-10-09 DIAGNOSIS — H47323 Drusen of optic disc, bilateral: Secondary | ICD-10-CM | POA: Diagnosis not present

## 2022-01-02 DIAGNOSIS — Z1231 Encounter for screening mammogram for malignant neoplasm of breast: Secondary | ICD-10-CM | POA: Diagnosis not present

## 2022-01-08 DIAGNOSIS — Z13228 Encounter for screening for other metabolic disorders: Secondary | ICD-10-CM | POA: Diagnosis not present

## 2022-01-08 DIAGNOSIS — M810 Age-related osteoporosis without current pathological fracture: Secondary | ICD-10-CM | POA: Diagnosis not present

## 2022-01-08 DIAGNOSIS — Z130 Encounter for screening for diseases of the blood and blood-forming organs and certain disorders involving the immune mechanism: Secondary | ICD-10-CM | POA: Diagnosis not present

## 2022-01-08 DIAGNOSIS — Z0000 Encounter for general adult medical examination without abnormal findings: Secondary | ICD-10-CM | POA: Diagnosis not present

## 2022-01-08 DIAGNOSIS — M25572 Pain in left ankle and joints of left foot: Secondary | ICD-10-CM | POA: Diagnosis not present

## 2022-01-08 DIAGNOSIS — Z7689 Persons encountering health services in other specified circumstances: Secondary | ICD-10-CM | POA: Diagnosis not present

## 2022-01-08 DIAGNOSIS — Z79899 Other long term (current) drug therapy: Secondary | ICD-10-CM | POA: Diagnosis not present

## 2022-01-08 DIAGNOSIS — E7800 Pure hypercholesterolemia, unspecified: Secondary | ICD-10-CM | POA: Diagnosis not present

## 2022-01-10 DIAGNOSIS — M810 Age-related osteoporosis without current pathological fracture: Secondary | ICD-10-CM | POA: Diagnosis not present

## 2022-01-10 DIAGNOSIS — E7800 Pure hypercholesterolemia, unspecified: Secondary | ICD-10-CM | POA: Diagnosis not present

## 2022-01-10 DIAGNOSIS — Z0000 Encounter for general adult medical examination without abnormal findings: Secondary | ICD-10-CM | POA: Diagnosis not present

## 2022-01-10 DIAGNOSIS — Z789 Other specified health status: Secondary | ICD-10-CM | POA: Diagnosis not present

## 2022-02-14 ENCOUNTER — Other Ambulatory Visit: Payer: Self-pay

## 2022-02-14 ENCOUNTER — Emergency Department (HOSPITAL_BASED_OUTPATIENT_CLINIC_OR_DEPARTMENT_OTHER)
Admission: EM | Admit: 2022-02-14 | Discharge: 2022-02-14 | Disposition: A | Discharge status: Home / Self Care | Payer: Medicare HMO | Attending: Emergency Medicine | Admitting: Emergency Medicine

## 2022-02-14 ENCOUNTER — Encounter (HOSPITAL_BASED_OUTPATIENT_CLINIC_OR_DEPARTMENT_OTHER): Payer: Self-pay | Admitting: Urology

## 2022-02-14 DIAGNOSIS — Z8543 Personal history of malignant neoplasm of ovary: Secondary | ICD-10-CM | POA: Insufficient documentation

## 2022-02-14 DIAGNOSIS — Z20822 Contact with and (suspected) exposure to covid-19: Secondary | ICD-10-CM | POA: Diagnosis not present

## 2022-02-14 DIAGNOSIS — Z96652 Presence of left artificial knee joint: Secondary | ICD-10-CM | POA: Insufficient documentation

## 2022-02-14 DIAGNOSIS — J069 Acute upper respiratory infection, unspecified: Secondary | ICD-10-CM | POA: Diagnosis not present

## 2022-02-14 DIAGNOSIS — J029 Acute pharyngitis, unspecified: Secondary | ICD-10-CM | POA: Insufficient documentation

## 2022-02-14 DIAGNOSIS — Z91040 Latex allergy status: Secondary | ICD-10-CM | POA: Diagnosis not present

## 2022-02-14 DIAGNOSIS — R059 Cough, unspecified: Secondary | ICD-10-CM | POA: Diagnosis present

## 2022-02-14 HISTORY — DX: Malignant neoplasm of unspecified ovary: C56.9

## 2022-02-14 LAB — SARS CORONAVIRUS 2 BY RT PCR: SARS Coronavirus 2 by RT PCR: NEGATIVE

## 2022-02-14 LAB — GROUP A STREP BY PCR: Group A Strep by PCR: NOT DETECTED

## 2022-02-14 MED ORDER — OXYMETAZOLINE HCL 0.05 % NA SOLN
2.0000 | Freq: Two times a day (BID) | NASAL | Status: DC | PRN
  Administered 2022-02-14: 2 via NASAL
  Filled 2022-02-14: qty 30

## 2022-02-14 NOTE — ED Triage Notes (Addendum)
Pt states hoarse voice and sore throat x 4 days Denies fever  Chest congestion with mild cough  Just got home from trip to [place]

## 2022-02-14 NOTE — ED Notes (Signed)
ED Provider at bedside. 

## 2022-02-14 NOTE — ED Provider Notes (Signed)
[facility] DEPT [facility] Provider Note: Georgena Spurling, MD, FACEP  CSN: [id number] MRN: [id number] ARRIVAL: 02/14/22 at [place]: [place] [facility]   HISTORY OF PRESENT ILLNESS  02/14/22 11:00 PM Angela Garza is a 76 y.o. female who recently vacationed in [place].  For the past 4 days she has had cold symptoms.  Specifically she has had nasal congestion, rhinorrhea, sore throat and cough.  She has not had shortness of breath or fever.  She rates her throat pain as a 5 out of 10, worse with swallowing.  She was exposed to COVID on the trip.   Past Medical History:  Diagnosis Date   Allergy    Anxiety    Arthritis    Cataract    Constipation    Hyperlipidemia    Insomnia    Osteoporosis    Ovarian cancer ([place])    Seasonal allergies     Past Surgical History:  Procedure Laterality Date   ABDOMINAL HYSTERECTOMY     cataract eye surgery Bilateral    COLONOSCOPY  01/2015   [facility] int hem/fhcc   COSMETIC SURGERY     eyelid lift surgery   HAMMERTOE RECONSTRUCTION WITH WEIL OSTEOTOMY Right 10/22/2017   Procedure: 2-3 Malka So Osteotomies; Second Hammertoe Correction;  Surgeon: Wylene Simmer, MD;  Location: [facility];  Service: Orthopedics;  Laterality: Right;   knee replacements Bilateral    open abd procedure     (2nd) recheck to make they got all cancer- washings   TARSAL METATARSAL FUSION WITH WEIL OSTEOTOMY Right 10/22/2017   Procedure: Right [place] First Tarsometatarsal Arthrodesis; Right Modified Alphonzo Grieve;  Surgeon: Wylene Simmer, MD;  Location: [facility];  Service: Orthopedics;  Laterality: Right;   THUMB FUSION     TOTAL KNEE ARTHROPLASTY Left 07/14/2008   WISDOM TOOTH EXTRACTION      Family History  Problem Relation Age of Onset   Colon cancer Father    Rectal cancer Neg Hx    Stomach cancer Neg Hx     Social History   Tobacco Use   Smoking status: Never   Smokeless tobacco: Never   Vaping Use   Vaping Use: Never used  Substance Use Topics   Alcohol use: Yes    Alcohol/week: 7.0 standard drinks of alcohol    Types: 7 Glasses of wine per week   Drug use: Never    Prior to Admission medications   Medication Sig Start Date End Date Taking? Authorizing Provider  alendronate (FOSAMAX) 70 MG tablet Take 70 mg by mouth once a week. Take with a full glass of water on an empty stomach. Takes on Saturday    [provider]  calcium-vitamin D (OSCAL WITH D) 500-200 MG-UNIT tablet Take 1 tablet by mouth 2 (two) times daily.     [provider]  Cholecalciferol 25 MCG (1000 UT) tablet Take by mouth.    [provider]  EPINEPHrine 0.3 mg/0.3 mL IJ SOAJ injection Inject into the muscle.     [provider]  Naproxen Sodium (ALEVE PO) Take 1-2 capsules by mouth every 6 (six) hours as needed.    [provider]  Probiotic Product (PROBIOTIC DAILY PO) Take by mouth.    [provider]  simvastatin (ZOCOR) 20 MG tablet Take 20 mg by mouth daily.    [provider]  zolpidem (AMBIEN) 5 MG tablet Take 5 mg by mouth at bedtime as needed for sleep.  [provider]    Allergies Mercury, Benzoin, Cephalexin, Lidocaine, Oxycodone, Erythromycin base, and Latex   REVIEW OF SYSTEMS  Negative except as noted here or in the History of Present Illness.   PHYSICAL EXAMINATION  Initial Vital Signs Blood pressure (!) 170/68, pulse 83, temperature 98.8 F (37.1 C), resp. rate 18, height '5\' 1"'$  (1.549 m), weight 61.2 kg, SpO2 100 %.  Examination General: Well-developed, well-nourished female in no acute distress; appearance consistent with age of record HENT: normocephalic; atraumatic; TMs normal; nasal congestion; mild pharyngeal erythema without edema or exudate Eyes: pupils equal, round and reactive to light; extraocular muscles intact; bilateral pseudophakia Neck: supple Heart: regular rate and rhythm Lungs:  clear to auscultation bilaterally Abdomen: soft; nondistended; nontender; bowel sounds present Extremities: No deformity; full range of motion; trace edema of lower legs Neurologic: Awake, alert and oriented; motor function intact in all extremities and symmetric; no facial droop Skin: Warm and dry Psychiatric: Normal mood and affect   RESULTS  Summary of this visit's results, reviewed and interpreted by myself:   EKG Interpretation  Date/Time:    Ventricular Rate:    PR Interval:    QRS Duration:   QT Interval:    QTC Calculation:   R Axis:     Text Interpretation:         Laboratory Studies: Results for orders placed or performed during the hospital encounter of 02/14/22 (from the past 24 hour(s))  SARS Coronavirus 2 by RT PCR (hospital order, performed in [facility] hospital lab) *cepheid single result test* Anterior Nasal Swab     Status: None   Collection Time: 02/14/22  7:33 PM   Specimen: Anterior Nasal Swab  Result Value Ref Range   SARS Coronavirus 2 by RT PCR NEGATIVE NEGATIVE  Group A Strep by PCR     Status: None   Collection Time: 02/14/22  7:33 PM   Specimen: Throat; Sterile Swab  Result Value Ref Range   Group A Strep by PCR NOT DETECTED NOT DETECTED   Imaging Studies: No results found.  ED COURSE and MDM  Nursing notes, initial and subsequent vitals signs, including pulse oximetry, reviewed and interpreted by myself.  Vitals:   02/14/22 1931 02/14/22 1933 02/14/22 2316  BP:  (!) 170/68 (!) 143/50  Pulse:  83 74  Resp:  18 18  Temp:  98.8 F (37.1 C)   SpO2:  100% 98%  Weight: 61.2 kg    Height: '5\' 1"'$  (1.549 m)     Medications  oxymetazoline (AFRIN) 0.05 % nasal spray 2 spray (has no administration in time range)    Patient negative for strep and COVID.  She is status post tonsillectomy.  I do not believe any antibiotics are indicated.  Will but will provide some Afrin for relief of her nasal congestion with the caveat not to use it more than  3 days.  PROCEDURES  Procedures   ED DIAGNOSES     ICD-10-CM   1. Viral URI with cough  J06.9          Mackensi Mahadeo, MD 02/14/22 2317

## 2024-08-04 ENCOUNTER — Encounter: Payer: Self-pay | Admitting: Gastroenterology

## 2024-09-16 ENCOUNTER — Ambulatory Visit: Admitting: Gastroenterology
